# Patient Record
Sex: Female | Born: 1985 | Race: Black or African American | Hispanic: No | Marital: Single | Smoking: Current every day smoker
Health system: Southern US, Community
[De-identification: ages and names within clinical notes are randomized; demographics above are authoritative.]

## PROBLEM LIST (undated history)

## (undated) DIAGNOSIS — G894 Chronic pain syndrome: Secondary | ICD-10-CM

## (undated) DIAGNOSIS — M542 Cervicalgia: Secondary | ICD-10-CM

## (undated) DIAGNOSIS — K219 Gastro-esophageal reflux disease without esophagitis: Secondary | ICD-10-CM

## (undated) DIAGNOSIS — I10 Essential (primary) hypertension: Secondary | ICD-10-CM

## (undated) HISTORY — DX: Cervicalgia: M54.2

## (undated) HISTORY — DX: Chronic pain syndrome: G89.4

## (undated) HISTORY — PX: TONSILLECTOMY: SUR1361

## (undated) HISTORY — PX: HERNIA REPAIR: SHX51

---
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
## 2016-07-05 DIAGNOSIS — I10 Essential (primary) hypertension: Secondary | ICD-10-CM | POA: Insufficient documentation

## 2016-07-05 DIAGNOSIS — G43909 Migraine, unspecified, not intractable, without status migrainosus: Secondary | ICD-10-CM | POA: Insufficient documentation

## 2016-07-05 DIAGNOSIS — F17210 Nicotine dependence, cigarettes, uncomplicated: Secondary | ICD-10-CM | POA: Insufficient documentation

## 2016-07-06 ENCOUNTER — Emergency Department (HOSPITAL_COMMUNITY)
Admission: EM | Admit: 2016-07-06 | Discharge: 2016-07-06 | Disposition: A | Discharge status: Home / Self Care | Payer: Self-pay | Attending: Emergency Medicine | Admitting: Emergency Medicine

## 2016-07-06 ENCOUNTER — Encounter (HOSPITAL_COMMUNITY): Payer: Self-pay | Admitting: Emergency Medicine

## 2016-07-06 DIAGNOSIS — G43009 Migraine without aura, not intractable, without status migrainosus: Secondary | ICD-10-CM

## 2016-07-06 DIAGNOSIS — Z8679 Personal history of other diseases of the circulatory system: Secondary | ICD-10-CM

## 2016-07-06 HISTORY — DX: Gastro-esophageal reflux disease without esophagitis: K21.9

## 2016-07-06 HISTORY — DX: Essential (primary) hypertension: I10

## 2016-07-06 LAB — CBC WITH DIFFERENTIAL/PLATELET
BASOS PCT: 0 %
Basophils Absolute: 0 10*3/uL (ref 0.0–0.1)
EOS ABS: 0.2 10*3/uL (ref 0.0–0.7)
Eosinophils Relative: 2 %
HCT: 36.2 % (ref 36.0–46.0)
Hemoglobin: 12.4 g/dL (ref 12.0–15.0)
Lymphocytes Relative: 35 %
Lymphs Abs: 3 10*3/uL (ref 0.7–4.0)
MCH: 28.8 pg (ref 26.0–34.0)
MCHC: 34.3 g/dL (ref 30.0–36.0)
MCV: 84 fL (ref 78.0–100.0)
MONO ABS: 0.6 10*3/uL (ref 0.1–1.0)
MONOS PCT: 7 %
Neutro Abs: 4.8 10*3/uL (ref 1.7–7.7)
Neutrophils Relative %: 56 %
Platelets: 268 10*3/uL (ref 150–400)
RBC: 4.31 MIL/uL (ref 3.87–5.11)
RDW: 12.5 % (ref 11.5–15.5)
WBC: 8.7 10*3/uL (ref 4.0–10.5)

## 2016-07-06 LAB — BASIC METABOLIC PANEL
Anion gap: 5 (ref 5–15)
BUN: 13 mg/dL (ref 6–20)
CALCIUM: 8.7 mg/dL — AB (ref 8.9–10.3)
CO2: 24 mmol/L (ref 22–32)
CREATININE: 0.79 mg/dL (ref 0.44–1.00)
Chloride: 105 mmol/L (ref 101–111)
GFR calc non Af Amer: >60 mL/min (ref >60)
Glucose, Bld: 104 mg/dL — ABNORMAL HIGH (ref 65–99)
Potassium: 3.5 mmol/L (ref 3.5–5.1)
SODIUM: 134 mmol/L — AB (ref 135–145)

## 2016-07-06 MED ORDER — PROCHLORPERAZINE EDISYLATE 5 MG/ML IJ SOLN
10.0000 mg | Freq: Once | INTRAMUSCULAR | Status: AC
  Administered 2016-07-06: 10 mg via INTRAVENOUS
  Filled 2016-07-06: qty 2

## 2016-07-06 MED ORDER — DIPHENHYDRAMINE HCL 50 MG/ML IJ SOLN
25.0000 mg | Freq: Once | INTRAMUSCULAR | Status: AC
  Administered 2016-07-06: 25 mg via INTRAVENOUS
  Filled 2016-07-06: qty 1

## 2016-07-06 MED ORDER — MAGNESIUM SULFATE 2 GM/50ML IV SOLN
2.0000 g | Freq: Once | INTRAVENOUS | Status: AC
  Administered 2016-07-06: 2 g via INTRAVENOUS
  Filled 2016-07-06: qty 50

## 2016-07-06 NOTE — Discharge Instructions (Signed)
You were seen today with concerns that you may have high blood pressure. Your blood pressure here has been reassuring. Your symptoms are more consistent with a migraine. Given that your blood pressure is normal here, you will not be reinitiated on blood pressure medication. You need to follow-up to establish primary care. If you have any new or worsening symptoms she needs to be reevaluated.

## 2016-07-06 NOTE — ED Triage Notes (Signed)
Pt states that she thinks her BP is elevated d/t neck pain and vision changes she has been experiencing for last 3 days. Pt states she has been off her Bp meds for last 6 months d/t insurance issues.

## 2016-07-06 NOTE — ED Provider Notes (Signed)
[facility]-EM[facility]Y DEPT Provider Note   CSN: [id number] Arrival date & time: 07/05/16  2350  By signing my name below, I, Modena JanskyAlbert Thayil, attest that this documentation has been prepared under the direction and in the presence of Shon Batonourtney F Horton, MD . Electronically Signed: Modena JanskyAlbert Thayil, Scribe. 07/06/2016. 12:32 AM.  History   Chief Complaint Chief Complaint  Patient presents with  . Hypertension   The history is provided by the patient. No language interpreter was used.   HPI Comments: Lydia Norton is a 30 y.o. female with a hx of HTN who presents to the Emergency Department complaining of hypertension that started about 3 days ago. She states she has also been having neck pain that shoots to her head and dizziness. She currently rates the neck pain as a 10/10. She states she has intermittent numbness/ltingling to hands and photophobia. She reports being off her blood pressure medication due to insurance issues. She admits to a prior hx of similar complaints. Also has a history of migraines but reports that she doesn't have a significant headache: Neck pain at this time. She denies any fever, headache, or other complaints.   Past Medical History:  Diagnosis Date  . GERD (gastroesophageal reflux disease)   . Hypertension     There are no active problems to display for this patient.   Past Surgical History:  Procedure Laterality Date  . HERNIA REPAIR    . TONSILLECTOMY      OB History    No data available       Home Medications    Prior to Admission medications   Not on File    Family History No family history on file.  Social History Social History  Substance Use Topics  . Smoking status: Current Every Day Smoker    Packs/day: 1.00    Types: Cigarettes  . Smokeless tobacco: Never Used  . Alcohol use No     Allergies   Patient has no known allergies.   Review of Systems Review of Systems  Constitutional: Negative for fever.  Eyes: Positive for  photophobia.  Respiratory: Negative for shortness of breath.   Cardiovascular: Negative for chest pain.  Gastrointestinal: Negative for abdominal pain, nausea and vomiting.  Musculoskeletal: Positive for neck pain. Negative for neck stiffness.  Neurological: Positive for numbness. Negative for headaches.  All other systems reviewed and are negative.    Physical Exam Updated Vital Signs BP (!) 156/108 (BP Location: Right Wrist)   Pulse 83   Temp 97.9 F (36.6 C) (Oral)   Resp 20   Ht 5\' 2"  (1.575 m)   Wt 215 lb (97.5 kg)   LMP 06/12/2016 (Exact Date)   SpO2 97%   BMI 39.32 kg/m   Physical Exam  Constitutional: She is oriented to person, place, and time. She appears well-developed and well-nourished.  Morbidly obese  HENT:  Head: Normocephalic and atraumatic.  Eyes: EOM are normal. Pupils are equal, round, and reactive to light.  No nystagmus noted  Neck: Normal range of motion. Neck supple.  Cardiovascular: Normal rate, regular rhythm and normal heart sounds.   Pulmonary/Chest: Effort normal and breath sounds normal. No respiratory distress. She has no wheezes.  Abdominal: Soft. Bowel sounds are normal. There is no tenderness.  Neurological: She is alert and oriented to person, place, and time.  Cranial nerves II through XII intact, no dysmetria to finger-nose-finger, 5 out of 5 strength in all 4 extremities  Skin: Skin is warm and dry.  Psychiatric:  She has a normal mood and affect.  Nursing note and vitals reviewed.    ED Treatments / Results  DIAGNOSTIC STUDIES: Oxygen Saturation is 97% on RA, normal by my interpretation.    COORDINATION OF CARE: 12:37 AM- Pt advised of plan for treatment and pt agrees.  Labs (all labs ordered are listed, but only abnormal results are displayed) Labs Reviewed  BASIC METABOLIC PANEL - Abnormal; Notable for the following:       Result Value   Sodium 134 (*)    Glucose, Bld 104 (*)    Calcium 8.7 (*)    All other components  within normal limits  CBC WITH DIFFERENTIAL/PLATELET    EKG  EKG Interpretation  Date/Time:  Tuesday July 06 2016 00:45:59 EST Ventricular Rate:  82 PR Interval:    QRS Duration: 95 QT Interval:  387 QTC Calculation: 452 R Axis:   6 Text Interpretation:  Sinus rhythm Low voltage, precordial leads Confirmed by HORTON  MD, Toni AmendOURTNEY ([id number]) on 07/06/2016 12:58:28 AM       Radiology No results found.  Procedures Procedures (including critical care time)  Medications Ordered in ED Medications  prochlorperazine (COMPAZINE) injection 10 mg (10 mg Intravenous Given 07/06/16 0107)  diphenhydrAMINE (BENADRYL) injection 25 mg (25 mg Intravenous Given 07/06/16 0106)  magnesium sulfate IVPB 2 g 50 mL (2 g Intravenous New Bag/Given 07/06/16 0107)     Initial Impression / Assessment and Plan / ED Course  I have reviewed the triage vital signs and the nursing notes.  Pertinent labs & imaging results that were available during my care of the patient were reviewed by me and considered in my medical decision making (see chart for details).  Clinical Course     Patient presents with concerns for elevated blood pressure. Initial blood pressure elevated, specifically diastolic. However patient's cough does not appear to be appropriately sized. With recheck, patient's blood pressures 124/75. She is neurologically intact. She does have a history of migraines and has photophobia and some characteristics of migraine based on history of present illness. She was given migraine cocktail. Lab work obtained and largely reassuring. EKG is nonischemic. On recheck, patient is resting comfortably. She reports complete resolution of symptoms. Suspect atypical migraine. Multiple blood pressure rechecks are within normal range. Will defer reinitiating blood pressure medications at this time. Patient was given information to establish primary care for close follow-up.  After history, exam, and medical workup  I feel the patient has been appropriately medically screened and is safe for discharge home. Pertinent diagnoses were discussed with the patient. Patient was given return precautions.   Final Clinical Impressions(s) / ED Diagnoses   Final diagnoses:  Migraine without aura and without status migrainosus, not intractable  History of hypertension    New Prescriptions New Prescriptions   No medications on file   I personally performed the services described in this documentation, which was scribed in my presence. The recorded information has been reviewed and is accurate.     Shon Batonourtney F Horton, MD 07/06/16 [phone]

## 2016-07-06 NOTE — ED Notes (Signed)
BP cuff changed and BP in pt's R upper arm was 124/75.

## 2016-07-21 ENCOUNTER — Emergency Department (HOSPITAL_COMMUNITY)
Admission: EM | Admit: 2016-07-21 | Discharge: 2016-07-21 | Disposition: A | Discharge status: Home / Self Care | Payer: Medicaid Other | Attending: Emergency Medicine | Admitting: Emergency Medicine

## 2016-07-21 ENCOUNTER — Encounter (HOSPITAL_COMMUNITY): Payer: Self-pay | Admitting: Emergency Medicine

## 2016-07-21 DIAGNOSIS — J020 Streptococcal pharyngitis: Secondary | ICD-10-CM

## 2016-07-21 DIAGNOSIS — I10 Essential (primary) hypertension: Secondary | ICD-10-CM | POA: Diagnosis not present

## 2016-07-21 DIAGNOSIS — K0889 Other specified disorders of teeth and supporting structures: Secondary | ICD-10-CM | POA: Diagnosis not present

## 2016-07-21 DIAGNOSIS — F17210 Nicotine dependence, cigarettes, uncomplicated: Secondary | ICD-10-CM | POA: Insufficient documentation

## 2016-07-21 DIAGNOSIS — J029 Acute pharyngitis, unspecified: Secondary | ICD-10-CM | POA: Diagnosis present

## 2016-07-21 LAB — RAPID STREP SCREEN (MED CTR MEBANE ONLY): Streptococcus, Group A Screen (Direct): POSITIVE — AB

## 2016-07-21 MED ORDER — ONDANSETRON 8 MG PO TBDP
8.0000 mg | ORAL_TABLET | Freq: Once | ORAL | Status: AC
  Administered 2016-07-21: 8 mg via ORAL
  Filled 2016-07-21: qty 1

## 2016-07-21 MED ORDER — AMOXICILLIN 500 MG PO CAPS: 500.0000 mg | ORAL_CAPSULE | Freq: Three times a day (TID) | ORAL | 0 refills | Status: AC

## 2016-07-21 MED ORDER — IBUPROFEN 800 MG PO TABS
800.0000 mg | ORAL_TABLET | Freq: Once | ORAL | Status: AC
  Administered 2016-07-21: 800 mg via ORAL
  Filled 2016-07-21: qty 1

## 2016-07-21 MED ORDER — MAGIC MOUTHWASH W/LIDOCAINE: 10.0000 mL | Freq: Four times a day (QID) | ORAL | 0 refills | Status: DC | PRN

## 2016-07-21 NOTE — ED Provider Notes (Signed)
[facility] Provider Note   CSN: [id number] Arrival date & time: 07/21/16  [id number]     History   Chief Complaint Chief Complaint  Patient presents with  . Sore Throat    HPI Lydia Norton is a 30 y.o. female presenting with multiple complaints, mainly suspects she may have the flu given her recent exposure to her uncle with this diagnosis.  She endorses Symptoms which include severe sore throat, worse with swallowing and radiation to her right ear, subjective fever, nasal congestion with clear rhinorrhea including postnasal drip, generalized body aches, nonproductive cough.  She also endorses right lower dentition pain, stating she has chronic decay and fractures in her molars with increased pain but no gingival swelling today.  She has difficulty eating and states she got choked yesterday due to severe throat pain which caused vomiting, although she denies abdominal pain or persistent nausea.  She has had a dose of TheraFlu yesterday and a borrowed Percocet from a family member which have not improved her symptoms.    Of note, patient presents with a can of Pringles, box of Crunch and Munch and a fruit drink.   The history is provided by the patient.    Past Medical History:  Diagnosis Date  . GERD (gastroesophageal reflux disease)   . Hypertension     There are no active problems to display for this patient.   Past Surgical History:  Procedure Laterality Date  . HERNIA REPAIR    . TONSILLECTOMY      OB History    No data available       Home Medications    Prior to Admission medications   Medication Sig Start Date End Date Taking? Authorizing Provider  amoxicillin (AMOXIL) 500 MG capsule Take 1 capsule (500 mg total) by mouth 3 (three) times daily. 07/21/16 07/31/16  Burgess AmorJulie Alicha Raspberry, PA-C  magic mouthwash w/lidocaine SOLN Take 10 mLs by mouth 4 (four) times daily as needed (throat pain). Note to pharmacy - equal parts diphendydramine, aluminum hydroxide and  lidocaine HCL 07/21/16   Burgess AmorJulie Brystol Wasilewski, PA-C    Family History History reviewed. No pertinent family history.  Social History Social History  Substance Use Topics  . Smoking status: Current Every Day Smoker    Packs/day: 1.00    Types: Cigarettes  . Smokeless tobacco: Never Used  . Alcohol use No     Allergies   Lisinopril   Review of Systems Review of Systems  Constitutional: Positive for fever.  HENT: Positive for congestion, dental problem, postnasal drip and rhinorrhea. Negative for sinus pain.   Eyes: Negative.   Respiratory: Positive for cough. Negative for chest tightness and shortness of breath.   Cardiovascular: Negative for chest pain.  Gastrointestinal: Positive for vomiting. Negative for abdominal pain and nausea.  Genitourinary: Negative.   Musculoskeletal: Negative for arthralgias, joint swelling and neck pain.  Skin: Negative.  Negative for rash and wound.  Neurological: Negative for dizziness, weakness, light-headedness, numbness and headaches.  Psychiatric/Behavioral: Negative.      Physical Exam Updated Vital Signs BP (!) 140/105 (BP Location: Right Arm)   Pulse 102   Temp 98.3 F (36.8 C)   Resp 18   Ht 5\' 1"  (1.549 m)   Wt 95.3 kg   LMP 07/15/2016   SpO2 97%   BMI 39.68 kg/m   Physical Exam  Constitutional: She is oriented to person, place, and time. She appears well-developed and well-nourished.  HENT:  Head: Normocephalic and atraumatic.  Right Ear:  Tympanic membrane and ear canal normal.  Left Ear: Tympanic membrane and ear canal normal.  Nose: Mucosal edema and rhinorrhea present.  Mouth/Throat: Uvula is midline and mucous membranes are normal. Posterior oropharyngeal erythema present. No oropharyngeal exudate, posterior oropharyngeal edema or tonsillar abscesses.  Mild erythema of the soft palate.  Eyes: Conjunctivae are normal.  Cardiovascular: Normal rate and normal heart sounds.   Pulmonary/Chest: Effort normal. No respiratory  distress. She has no wheezes. She has no rales.  Abdominal: Soft. There is no tenderness.  Musculoskeletal: Normal range of motion.  Lymphadenopathy:    She has no cervical adenopathy.  Neurological: She is alert and oriented to person, place, and time.  Skin: Skin is warm and dry. No rash noted.  Psychiatric: She has a normal mood and affect.     ED Treatments / Results  Labs (all labs ordered are listed, but only abnormal results are displayed) Labs Reviewed  RAPID STREP SCREEN (NOT AT [facility]) - Abnormal; Notable for the following:       Result Value   Streptococcus, Group A Screen (Direct) POSITIVE (*)    All other components within normal limits    EKG  EKG Interpretation None       Radiology No results found.  Procedures Procedures (including critical care time)  Medications Ordered in ED Medications  ibuprofen (ADVIL,MOTRIN) tablet 800 mg (800 mg Oral Given 07/21/16 0914)  ondansetron (ZOFRAN-ODT) disintegrating tablet 8 mg (8 mg Oral Given 07/21/16 0914)     Initial Impression / Assessment and Plan / ED Course  I have reviewed the triage vital signs and the nursing notes.  Pertinent labs & imaging results that were available during my care of the patient were reviewed by me and considered in my medical decision making (see chart for details).  Clinical Course     Rest, increased fluids, Tylenol or Motrin for fever reduction in pain relief.  Amoxil and magic mouthwash prescribed.  The patient appears reasonably screened and/or stabilized for discharge and I doubt any other medical condition or other [facility] requiring further screening, evaluation, or treatment in the ED at this time prior to discharge.   Final Clinical Impressions(s) / ED Diagnoses   Final diagnoses:  Strep throat    New Prescriptions New Prescriptions   AMOXICILLIN (AMOXIL) 500 MG CAPSULE    Take 1 capsule (500 mg total) by mouth 3 (three) times daily.   MAGIC MOUTHWASH W/LIDOCAINE SOLN     Take 10 mLs by mouth 4 (four) times daily as needed (throat pain). Note to pharmacy - equal parts diphendydramine, aluminum hydroxide and lidocaine HCL     Burgess AmorJulie Rayley Gao, PA-C 07/21/16 1041    Marily MemosJason Mesner, MD 07/21/16 1427

## 2016-07-21 NOTE — ED Triage Notes (Signed)
C/o sore throat, chills, HA, lower back pain and right lower teeth.  Rates pain 10/10.

## 2016-10-05 ENCOUNTER — Encounter (HOSPITAL_COMMUNITY): Payer: Self-pay | Admitting: Emergency Medicine

## 2016-10-05 ENCOUNTER — Emergency Department (HOSPITAL_COMMUNITY)
Admission: EM | Admit: 2016-10-05 | Discharge: 2016-10-05 | Disposition: A | Discharge status: Home / Self Care | Payer: Medicaid Other | Attending: Emergency Medicine | Admitting: Emergency Medicine

## 2016-10-05 DIAGNOSIS — I10 Essential (primary) hypertension: Secondary | ICD-10-CM | POA: Insufficient documentation

## 2016-10-05 DIAGNOSIS — F17210 Nicotine dependence, cigarettes, uncomplicated: Secondary | ICD-10-CM | POA: Insufficient documentation

## 2016-10-05 DIAGNOSIS — R1030 Lower abdominal pain, unspecified: Secondary | ICD-10-CM | POA: Diagnosis present

## 2016-10-05 DIAGNOSIS — K5900 Constipation, unspecified: Secondary | ICD-10-CM | POA: Diagnosis not present

## 2016-10-05 LAB — COMPREHENSIVE METABOLIC PANEL
ALK PHOS: 49 U/L (ref 38–126)
ALT: 32 U/L (ref 14–54)
AST: 22 U/L (ref 15–41)
Albumin: 4 g/dL (ref 3.5–5.0)
Anion gap: 7 (ref 5–15)
BILIRUBIN TOTAL: 0.4 mg/dL (ref 0.3–1.2)
BUN: 10 mg/dL (ref 6–20)
CALCIUM: 9.1 mg/dL (ref 8.9–10.3)
CO2: 26 mmol/L (ref 22–32)
CREATININE: 0.61 mg/dL (ref 0.44–1.00)
Chloride: 104 mmol/L (ref 101–111)
Glucose, Bld: 93 mg/dL (ref 65–99)
Potassium: 3.4 mmol/L — ABNORMAL LOW (ref 3.5–5.1)
Sodium: 137 mmol/L (ref 135–145)
TOTAL PROTEIN: 7.4 g/dL (ref 6.5–8.1)

## 2016-10-05 LAB — CBC
HCT: 40.6 % (ref 36.0–46.0)
Hemoglobin: 14.1 g/dL (ref 12.0–15.0)
MCH: 29.1 pg (ref 26.0–34.0)
MCHC: 34.7 g/dL (ref 30.0–36.0)
MCV: 83.9 fL (ref 78.0–100.0)
PLATELETS: 278 10*3/uL (ref 150–400)
RBC: 4.84 MIL/uL (ref 3.87–5.11)
RDW: 13 % (ref 11.5–15.5)
WBC: 8.4 10*3/uL (ref 4.0–10.5)

## 2016-10-05 LAB — LIPASE, BLOOD: Lipase: 20 U/L (ref 11–51)

## 2016-10-05 NOTE — ED Notes (Signed)
ED Provider at bedside. 

## 2016-10-05 NOTE — Discharge Instructions (Signed)
Miralax - twice daily Magnesium citrate (drink a whole 6 or 8 ounce bottle today) Dulcolax twice daily  Until you are having regular daily stools.  [facility]    Kari BaarsEdward Hawkins MD. Specialty: Pulmonary Disease Contact information: [address]  [address]  [place] [address]  [phone]   Syliva OvermanMargaret Simpson, MD. Specialty: [facility] Contact information: [address]  [place] [address]  [phone]   Lilyan PuntScott Luking, MD. Specialty: [facility] Contact information: [address]  [address]  [place] [address]  [phone]   Avon Gullyesfaye Fanta, MD Specialty: Internal Medicine Contact information: [address]  [place] [address]  [phone]   Catalina PizzaZach Hall, MD. Specialty: Internal Medicine Contact information: [address]  [place] [address]  [phone]   Butch PennyAngus Mcinnis, MD. Specialty: Family Medicine Contact information: [address]  [place] [address]  [phone]   John GiovanniStephen Knowlton, MD. Specialty: [facility] Contact information: [address]  [address]  [place] [address]  [phone]   Carylon Perchesoy Fagan, MD. Specialty: Internal Medicine Contact information: [address]  [address]  [place] [address]  [phone]

## 2016-10-05 NOTE — ED Notes (Signed)
No prescriptions given. Pt educated about OTC medications pt can buy.

## 2016-10-05 NOTE — ED Triage Notes (Signed)
Patient complains of lower abdominal pain and constipation. States no bowel movement x 4 days. Denies n/v.

## 2016-10-05 NOTE — ED Provider Notes (Signed)
[facility] DEPT Provider Note   CSN: [id number] Arrival date & time: 10/05/16  [id number]  By signing my name below, I, Cynda AcresHailei Fulton, attest that this documentation has been prepared under the direction and in the presence of Eber HongBrian Monay Houlton, MD. Electronically Signed: Cynda AcresHailei Fulton, Scribe. 10/05/16. 11:25 AM.  History   Chief Complaint Chief Complaint  Patient presents with  . Abdominal Pain    HPI Comments: Lydia Norton is a 31 y.o. female with a hx of GERD and hypertension, who presents to the Emergency Department complaining of sudden-onset, constant lower abdominal pain that began 4 days ago. Patient states when she is using the bathroom her vagina feels as if something is coming out of her vagina. Patient reports associated constipation. Patient reports a 5 day use of loratab, due to chronic dental pain. Patient states she has been using her aunts medication. Patient is tolerating fluids well. Patient has not ate anything this morning, but ate well last night. Patient denies any alcohol use, nausea, vomiting, diarrhea, or fever.   The history is provided by the patient. No language interpreter was used.    Past Medical History:  Diagnosis Date  . GERD (gastroesophageal reflux disease)   . Hypertension     There are no active problems to display for this patient.   Past Surgical History:  Procedure Laterality Date  . HERNIA REPAIR    . TONSILLECTOMY      OB History    Gravida Para Term Preterm AB Living   1             SAB TAB Ectopic Multiple Live Births                   Home Medications    Prior to Admission medications   Not on File    Family History No family history on file.  Social History Social History  Substance Use Topics  . Smoking status: Current Every Day Smoker    Packs/day: 1.00    Types: Cigarettes  . Smokeless tobacco: Never Used  . Alcohol use No     Allergies   Lisinopril   Review of Systems Review of Systems  All other  systems reviewed and are negative.    Physical Exam Updated Vital Signs BP 144/86 (BP Location: Right Arm)   Pulse 72   Temp 98.2 F (36.8 C) (Oral)   Ht 5\' 3"  (1.6 m)   Wt 210 lb (95.3 kg)   LMP 08/16/2016   SpO2 100%   BMI 37.20 kg/m   Physical Exam  Constitutional: She is oriented to person, place, and time. She appears well-developed.  HENT:  Head: Normocephalic and atraumatic.  Mouth/Throat: Oropharynx is clear and moist.  Eyes: Conjunctivae and EOM are normal. Pupils are equal, round, and reactive to light.  Neck: Normal range of motion. Neck supple.  Cardiovascular: Normal rate, regular rhythm and normal heart sounds.   Pulmonary/Chest: Effort normal and breath sounds normal.  Abdominal: Soft. Bowel sounds are normal. She exhibits no distension. There is no tenderness.  Mild tenderness of the abdomen. Patient is very obese, but soft.   Musculoskeletal: Normal range of motion.  Neurological: She is alert and oriented to person, place, and time.  Skin: Skin is warm and dry.  Psychiatric: She has a normal mood and affect.     ED Treatments / Results  DIAGNOSTIC STUDIES: Oxygen Saturation is 100% on RA, normal by my interpretation.    COORDINATION OF CARE: 11:22  AM Discussed treatment plan with pt at bedside and pt agreed to plan, which includes a pelvic examination.   Labs (all labs ordered are listed, but only abnormal results are displayed) Labs Reviewed  COMPREHENSIVE METABOLIC PANEL - Abnormal; Notable for the following:       Result Value   Potassium 3.4 (*)    All other components within normal limits  LIPASE, BLOOD  CBC  URINALYSIS, ROUTINE W REFLEX MICROSCOPIC  PREGNANCY, URINE  POC URINE PREG, ED    Radiology No results found.  Procedures Procedures (including critical care time)  Medications Ordered in ED Medications - No data to display   Initial Impression / Assessment and Plan / ED Course  I have reviewed the triage vital signs and  the nursing notes.  Pertinent labs & imaging results that were available during my care of the patient were reviewed by me and considered in my medical decision making (see chart for details).   chaperone present for vaginal and rectal exam, normal-appearing vagina, no intravaginal foreign bodies, there is stool palpated in the posterior vaginal wall into the rectum. There is no connection, no stool in the vaginal vault, rectal exam shows hard stool which is mobile, no blood, no masses discussed at length with the patient the indication for further evaluation and treatment. She will be placed on MiraLAX, magnesium citrate and Dulcolax, she expressed understanding.     Final Clinical Impressions(s) / ED Diagnoses   Final diagnoses:  Constipation, unspecified constipation type    New Prescriptions New Prescriptions   No medications on file    I personally performed the services described in this documentation, which was scribed in my presence. The recorded information has been reviewed and is accurate.       Eber Hong, MD 10/05/16 1239

## 2017-09-29 ENCOUNTER — Other Ambulatory Visit: Payer: Self-pay | Admitting: Obstetrics & Gynecology

## 2017-09-29 DIAGNOSIS — O3680X0 Pregnancy with inconclusive fetal viability, not applicable or unspecified: Secondary | ICD-10-CM

## 2017-09-30 ENCOUNTER — Encounter (INDEPENDENT_AMBULATORY_CARE_PROVIDER_SITE_OTHER): Payer: Self-pay

## 2017-09-30 ENCOUNTER — Ambulatory Visit (INDEPENDENT_AMBULATORY_CARE_PROVIDER_SITE_OTHER): Payer: Medicaid Other

## 2017-09-30 DIAGNOSIS — O3680X0 Pregnancy with inconclusive fetal viability, not applicable or unspecified: Secondary | ICD-10-CM

## 2017-09-30 DIAGNOSIS — Z3A08 8 weeks gestation of pregnancy: Secondary | ICD-10-CM | POA: Diagnosis not present

## 2017-09-30 DIAGNOSIS — O208 Other hemorrhage in early pregnancy: Secondary | ICD-10-CM

## 2017-09-30 NOTE — Progress Notes (Addendum)
US 8+2 wks,single IUP w/ys,positive fht 109 bpm,normal ovaries bilat,subchorionic hemorrhage 5.4 x 3.8 x 1.6 cm,small GS 26.2 mm=7+4 wks

## 2017-10-06 ENCOUNTER — Other Ambulatory Visit: Payer: Self-pay | Admitting: Women's Health

## 2017-10-06 DIAGNOSIS — O3680X0 Pregnancy with inconclusive fetal viability, not applicable or unspecified: Secondary | ICD-10-CM

## 2017-10-10 ENCOUNTER — Ambulatory Visit: Payer: Medicaid Other | Admitting: Obstetrics & Gynecology

## 2017-10-10 ENCOUNTER — Other Ambulatory Visit: Payer: Self-pay | Admitting: Women's Health

## 2017-10-10 ENCOUNTER — Ambulatory Visit (INDEPENDENT_AMBULATORY_CARE_PROVIDER_SITE_OTHER): Payer: Medicaid Other

## 2017-10-10 DIAGNOSIS — Z3A08 8 weeks gestation of pregnancy: Secondary | ICD-10-CM | POA: Diagnosis not present

## 2017-10-10 DIAGNOSIS — O3680X0 Pregnancy with inconclusive fetal viability, not applicable or unspecified: Secondary | ICD-10-CM

## 2017-10-10 NOTE — Progress Notes (Signed)
US 8+4 wks,single IUP,NO FHT,CRL 20.50 mm,normal ovaries bilat,Dr Eure discussed results w/pt

## 2017-10-11 ENCOUNTER — Other Ambulatory Visit: Payer: Self-pay | Admitting: Obstetrics & Gynecology

## 2017-10-11 ENCOUNTER — Telehealth: Payer: Self-pay | Admitting: Obstetrics & Gynecology

## 2017-10-11 MED ORDER — HYDROCODONE-ACETAMINOPHEN 5-325 MG PO TABS: 1.0000 | ORAL_TABLET | Freq: Four times a day (QID) | ORAL | 0 refills | Status: AC | PRN

## 2017-10-11 MED ORDER — MISOPROSTOL 200 MCG PO TABS: ORAL_TABLET | ORAL | 0 refills | Status: DC

## 2017-10-11 NOTE — Telephone Encounter (Signed)
Called patient back and informed her of Dr. Forestine ChuteEure's recommendations. Patient has no further questions or concerns.

## 2017-10-11 NOTE — Telephone Encounter (Signed)
Pt would like to get the medication sent to her pharmacy for her sab. Patient would like to also know why she cannot have a d&c done.

## 2017-10-11 NOTE — Telephone Encounter (Signed)
For pregnancies less than 10 weeks D&C is not recommended, as the risks of complications of the D&C are greater than the risks of spontaneous pregnancy loss using the medicine or not  The prescription for the cytotec is e prescribed

## 2017-10-13 ENCOUNTER — Encounter: Payer: Medicaid Other | Admitting: Women's Health

## 2017-10-13 ENCOUNTER — Ambulatory Visit: Payer: Medicaid Other | Admitting: *Deleted

## 2017-10-13 ENCOUNTER — Other Ambulatory Visit: Payer: Medicaid Other

## 2018-09-29 ENCOUNTER — Encounter (HOSPITAL_COMMUNITY): Payer: Self-pay

## 2020-02-22 LAB — BASIC METABOLIC PANEL
BUN: 10 (ref 4–21)
Creatinine: 0.3 — AB (ref 0.5–1.1)

## 2020-02-22 LAB — COMPREHENSIVE METABOLIC PANEL
GFR calc Af Amer: 169
GFR calc non Af Amer: 146

## 2020-02-22 LAB — TSH: TSH: 0.01 — AB (ref 0.41–5.90)

## 2020-02-22 LAB — HEMOGLOBIN A1C: Hemoglobin A1C: 4.9

## 2020-02-23 LAB — LIPID PANEL
Cholesterol: 194 (ref 0–200)
HDL: 53 (ref 35–70)
LDL Cholesterol: 109
Triglycerides: 201 — AB (ref 40–160)

## 2020-06-05 ENCOUNTER — Other Ambulatory Visit: Payer: Self-pay

## 2020-06-05 ENCOUNTER — Encounter: Payer: Self-pay | Admitting: "Endocrinology

## 2020-06-05 ENCOUNTER — Ambulatory Visit (INDEPENDENT_AMBULATORY_CARE_PROVIDER_SITE_OTHER): Payer: Medicaid Other | Admitting: "Endocrinology

## 2020-06-05 VITALS — BP 130/72 | HR 88 | Ht 63.0 in | Wt 190.0 lb

## 2020-06-05 DIAGNOSIS — E0500 Thyrotoxicosis with diffuse goiter without thyrotoxic crisis or storm: Secondary | ICD-10-CM

## 2020-06-05 DIAGNOSIS — E0590 Thyrotoxicosis, unspecified without thyrotoxic crisis or storm: Secondary | ICD-10-CM

## 2020-06-05 MED ORDER — PROPRANOLOL HCL 20 MG PO TABS: 20.0000 mg | ORAL_TABLET | Freq: Two times a day (BID) | ORAL | 1 refills | Status: DC

## 2020-06-05 MED ORDER — PREDNISONE 10 MG PO TABS: 10.0000 mg | ORAL_TABLET | Freq: Every day | ORAL | 0 refills | Status: DC

## 2020-06-05 MED ORDER — METHIMAZOLE 10 MG PO TABS
10.0000 mg | ORAL_TABLET | Freq: Two times a day (BID) | ORAL | 1 refills | Status: DC
Start: 2020-06-05 — End: 2020-08-28

## 2020-06-05 NOTE — Progress Notes (Signed)
06/05/2020     Endocrinology Consult Note    Subjective:    Patient ID: Lydia Norton, female    DOB: [birth year], PCP Patient, No Pcp Per.   Past Medical History:  Diagnosis Date  . Cervicalgia   . Chronic pain syndrome   . GERD (gastroesophageal reflux disease)   . Hypertension     Past Surgical History:  Procedure Laterality Date  . HERNIA REPAIR    . TONSILLECTOMY      Social History   Socioeconomic History  . Marital status: Single    Spouse name: Not on file  . Number of children: Not on file  . Years of education: Not on file  . Highest education level: Not on file  Occupational History  . Not on file  Tobacco Use  . Smoking status: Current Every Day Smoker    Packs/day: 1.00    Types: Cigarettes  . Smokeless tobacco: Never Used  Substance and Sexual Activity  . Alcohol use: No  . Drug use: No  . Sexual activity: Not on file  Other Topics Concern  . Not on file  Social History Narrative  . Not on file   Social Determinants of Health   Financial Resource Strain:   . Difficulty of Paying Living Expenses: Not on file  Food Insecurity:   . Worried About Programme researcher, broadcasting/film/video in the Last Year: Not on file  . Ran Out of Food in the Last Year: Not on file  Transportation Needs:   . Lack of Transportation (Medical): Not on file  . Lack of Transportation (Non-Medical): Not on file  Physical Activity:   . Days of Exercise per Week: Not on file  . Minutes of Exercise per Session: Not on file  Stress:   . Feeling of Stress : Not on file  Social Connections:   . Frequency of Communication with Friends and Family: Not on file  . Frequency of Social Gatherings with Friends and Family: Not on file  . Attends Religious Services: Not on file  . Active Member of Clubs or Organizations: Not on file  . Attends Banker Meetings: Not on file  . Marital Status: Not on file    Family History  Problem Relation Age of Onset  . Hypertension  Mother   . Diabetes Father   . Hypertension Father   . Hyperlipidemia Father   . Heart attack Father   . Stroke Father   . Heart failure Father     Outpatient Encounter Medications as of 06/05/2020  Medication Sig  . amLODipine (NORVASC) 5 MG tablet Take 5 mg by mouth daily.  Marland Kitchen HYDROcodone-acetaminophen (NORCO/VICODIN) 5-325 MG tablet Take 1 tablet by mouth every 6 (six) hours as needed.  . methimazole (TAPAZOLE) 10 MG tablet Take 1 tablet (10 mg total) by mouth 2 (two) times daily.  Marland Kitchen omeprazole (PRILOSEC) 40 MG capsule Take 40 mg by mouth daily.  . predniSONE (DELTASONE) 10 MG tablet Take 1 tablet (10 mg total) by mouth daily with breakfast.  . propranolol (INDERAL) 20 MG tablet Take 1 tablet (20 mg total) by mouth 2 (two) times daily.  . [DISCONTINUED] methimazole (TAPAZOLE) 5 MG tablet Take 1 tablet by mouth 3 (three) times daily.  . [DISCONTINUED] misoprostol (CYTOTEC) 200 MCG tablet Take all 4 tablets at once, repeat in 24 hours if miscarriage has not occurred   No facility-administered encounter medications on file as of 06/05/2020.    ALLERGIES: Allergies  Allergen Reactions  . Lisinopril Swelling    VACCINATION STATUS:  There is no immunization history on file for this patient.   HPI  Lydia Norton is 34 y.o. female who presents today with a medical history as above. she is being seen in consultation for hyperthyroidism requested by  Trenda Moots, NP. Patient was started on methimazole 5 mg 3 times daily 2 months ago with clinical response. Prior to initiation of treatment,she has been dealing with symptoms of weight loss of 100+ pounds, palpitations, tremors, and heat intolerance, sleep disturbance on and off for more than a year.  -Although they are still there, the symptoms have slightly improved over the last several weeks.   -She did not undergo thyroid uptake and scan.  She underwent thyroid ultrasound which showed diffuse enlargement of her thyroid lobes  with no discrete nodules. she denies dysphagia, choking, shortness of breath, no recent voice change.    she has family history of thyroid dysfunction but denies family hx of thyroid cancer. she reports that she discovered her own goiter approximately 2 years ago.   she reports compliance with her methimazole 5 mg p.o. 3 times daily, not on beta-blockers, not on steroids.                            Review of systems  Constitutional: + weight loss, + fatigue, + subjective hyperthermia Eyes: no blurry vision, ++ xerophthalmia ENT: no sore throat, no nodules palpated in throat, no dysphagia/odynophagia, nor hoarseness Cardiovascular: no Chest Pain, no Shortness of Breath, ++  palpitations, no leg swelling Respiratory: no cough, no SOB Gastrointestinal: no Nausea, no Vomiting, no Diarhhea Musculoskeletal: no muscle/joint aches Skin: no rashes Neurological: ++  tremors, no numbness, no tingling, no dizziness Psychiatric: no depression, ++  anxiety   Objective:    BP 130/72   Pulse 88   Ht 5\' 3"  (1.6 m)   Wt 190 lb (86.2 kg)   BMI 33.66 kg/m   Wt Readings from Last 3 Encounters:  06/05/20 190 lb (86.2 kg)  10/05/16 210 lb (95.3 kg)  07/21/16 210 lb (95.3 kg)                                                Physical exam  Constitutional: Body mass index is 33.66 kg/m., not in acute distress, ++ anxious state of mind Eyes: PERRLA, EOMI, ++ exophthalmos ENT: moist mucous membranes, ++  Thyromegaly, smooth, + bruit, no cervical lymphadenopathy Cardiovascular: ++ precordial activity, -tachycardic,  no Murmur/Rubs/Gallops Respiratory:  adequate breathing efforts, no gross chest deformity, Clear to auscultation bilaterally Gastrointestinal: abdomen soft, Non -tender, No distension, Bowel Sounds present Musculoskeletal: no gross deformities, strength intact in all four extremities Skin: moist, warm, no rashes Neurological: ++  tremor with outstretched hands,  +++ Deep Tendon Reflexes  on  both lower extremities.   CMP     Component Value Date/Time   NA 137 10/05/2016 1101   K 3.4 (L) 10/05/2016 1101   CL 104 10/05/2016 1101   CO2 26 10/05/2016 1101   GLUCOSE 93 10/05/2016 1101   BUN 10 02/22/2020 0000   CREATININE 0.3 (A) 02/22/2020 0000   CREATININE 0.61 10/05/2016 1101   CALCIUM 9.1 10/05/2016 1101   PROT 7.4 10/05/2016 1101   ALBUMIN 4.0 10/05/2016 1101  AST 22 10/05/2016 1101   ALT 32 10/05/2016 1101   ALKPHOS 49 10/05/2016 1101   BILITOT 0.4 10/05/2016 1101   GFRNONAA 146 02/22/2020 0000   GFRAA 169 02/22/2020 0000     CBC    Component Value Date/Time   WBC 8.4 10/05/2016 1101   RBC 4.84 10/05/2016 1101   HGB 14.1 10/05/2016 1101   HCT 40.6 10/05/2016 1101   PLT 278 10/05/2016 1101   MCV 83.9 10/05/2016 1101   MCH 29.1 10/05/2016 1101   MCHC 34.7 10/05/2016 1101   RDW 13.0 10/05/2016 1101   LYMPHSABS 3.0 07/06/2016 0039   MONOABS 0.6 07/06/2016 0039   EOSABS 0.2 07/06/2016 0039   BASOSABS 0.0 07/06/2016 0039     Diabetic Labs (most recent): Lab Results  Component Value Date   HGBA1C 4.9 02/22/2020    Lipid Panel     Component Value Date/Time   CHOL 194 02/22/2020 0000   TRIG 201 (A) 02/22/2020 0000   HDL 53 02/22/2020 0000   LDLCALC 109 02/22/2020 0000     Lab Results  Component Value Date   TSH 0.01 (A) 02/22/2020     Thyroid ultrasound on May 13, 2020: Right lobe 6.5 x 3.1 x 3.2 cm, heterogeneous, vascular.  No nodules. Left lobe: 6.5 x 3.4 x 2.9 cm heterogeneous, vascular.  No nodules.   Assessment & Plan:   1. Hyperthyroidism 2. Graves disease  she is being seen at a kind request of Trenda Moots, NP. her history and most recent labs are reviewed, and she was examined clinically. Subjective and objective findings are consistent with thyrotoxicosis likely from primary hyperthyroidism/Graves' disease. The potential risks of untreated thyrotoxicosis and the need for definitive therapy have been discussed in  detail with her, and she agrees to proceed with diagnostic workup and treatment plan. We had a long discussion about options of treatment.  This patient will eventually benefit from radioactive iodine ablation.   -In light of her still significantly symptomatic presentation, she will be treated with higher dose of methimazole, beta-blocker, and brief course of steroids to achieve safe range of thyroid hormone before we withdrawing for thyroid uptake and scan.   We discussed and increase methimazole to 10 mg p.o. twice daily, add propanolol 20 mg p.o. twice daily, and added prednisone 10 mg p.o. daily for 15 days.    -Patient is urged to maintain close follow-up to complete the treatment of her severe hypothyroidism to prevent her from thyroid storm and acute and chronic complications of thyrotoxicosis.    Patient is made aware of the high likelihood of post ablative hypothyroidism with subsequent need for lifelong thyroid hormone replacement. she  understands this outcome  and she is  willing to proceed.    In light of her diffusely enlarged vascular thyroid with no discrete nodules, treatment with I-131 will be effective to lower the size of her thyroid to manageable degree, making surgery unnecessary in her case.  she will return in 9 weeks, with repeat labs to assess treatment response.  Her current degree of thyroid hormone burden will complicate pregnancy.  She is advised to postpone all pregnancy until her thyroid is controlled to safe range.  She is advised to use effective contraceptive methods to achieve that.  -I did refill her methimazole, prescribed propanolol and prednisone.  -Patient is advised to maintain close follow up with her PCP  for primary care needs.   - Time spent with the patient: 60 minutes, of which >50% was  spent in obtaining information about her symptoms, reviewing her previous labs, evaluations, and treatments, counseling her about her hyperthyroidism from Graves'  disease, and developing a plan to confirm the diagnosis and long term treatment as necessary. Please refer to " Patient Self Inventory" in the Media  tab for reviewed elements of pertinent patient history.  Lydia LoosenIllene K Rawling participated in the discussions, expressed understanding, and voiced agreement with the above plans.  All questions were answered to her satisfaction. she is encouraged to contact clinic should she have any questions or concerns prior to her return visit.   Follow up plan: Return in about 9 weeks (around 08/07/2020) for F/U with Pre-visit Labs.   Thank you for involving me in the care of this pleasant patient, and I will continue to update you with her progress.  Marquis LunchGebre Thia Olesen, MD [facility] Phone: [phone]  Fax: [phone]   06/05/2020, 11:17 AM  This note was partially dictated with voice recognition software. Similar sounding words can be transcribed inadequately or may not  be corrected upon review.

## 2020-06-27 ENCOUNTER — Other Ambulatory Visit: Payer: Self-pay | Admitting: "Endocrinology

## 2020-08-07 ENCOUNTER — Other Ambulatory Visit (HOSPITAL_COMMUNITY)
Admission: RE | Admit: 2020-08-07 | Discharge: 2020-08-07 | Disposition: A | Discharge status: Home / Self Care | Payer: Medicaid Other | Source: Ambulatory Visit | Attending: "Endocrinology | Admitting: "Endocrinology

## 2020-08-07 ENCOUNTER — Other Ambulatory Visit: Payer: Self-pay

## 2020-08-07 ENCOUNTER — Ambulatory Visit: Payer: Medicaid Other | Admitting: "Endocrinology

## 2020-08-07 DIAGNOSIS — E0500 Thyrotoxicosis with diffuse goiter without thyrotoxic crisis or storm: Secondary | ICD-10-CM | POA: Diagnosis present

## 2020-08-07 DIAGNOSIS — E0590 Thyrotoxicosis, unspecified without thyrotoxic crisis or storm: Secondary | ICD-10-CM | POA: Diagnosis not present

## 2020-08-07 LAB — TSH: TSH: <0.01 u[IU]/mL — ABNORMAL LOW (ref 0.350–4.500)

## 2020-08-07 LAB — T4, FREE: Free T4: 1.82 ng/dL — ABNORMAL HIGH (ref 0.61–1.12)

## 2020-08-08 LAB — T3, FREE: T3, Free: 8 pg/mL — ABNORMAL HIGH (ref 2.0–4.4)

## 2020-08-08 LAB — THYROID PEROXIDASE ANTIBODY: Thyroperoxidase Ab SerPl-aCnc: 246 IU/mL — ABNORMAL HIGH (ref 0–34)

## 2020-08-11 LAB — THYROGLOBULIN ANTIBODY: Thyroglobulin Antibody: 1.2 IU/mL — ABNORMAL HIGH (ref 0.0–0.9)

## 2020-08-13 ENCOUNTER — Encounter: Payer: Self-pay | Admitting: "Endocrinology

## 2020-08-13 ENCOUNTER — Other Ambulatory Visit: Payer: Self-pay

## 2020-08-13 ENCOUNTER — Ambulatory Visit (INDEPENDENT_AMBULATORY_CARE_PROVIDER_SITE_OTHER): Payer: Medicaid Other | Admitting: "Endocrinology

## 2020-08-13 VITALS — BP 160/102 | HR 92 | Ht 63.0 in | Wt 185.0 lb

## 2020-08-13 DIAGNOSIS — E0500 Thyrotoxicosis with diffuse goiter without thyrotoxic crisis or storm: Secondary | ICD-10-CM | POA: Diagnosis not present

## 2020-08-13 DIAGNOSIS — E0590 Thyrotoxicosis, unspecified without thyrotoxic crisis or storm: Secondary | ICD-10-CM | POA: Insufficient documentation

## 2020-08-13 DIAGNOSIS — I10 Essential (primary) hypertension: Secondary | ICD-10-CM | POA: Insufficient documentation

## 2020-08-13 MED ORDER — AMLODIPINE BESYLATE 10 MG PO TABS: 10.0000 mg | ORAL_TABLET | Freq: Every day | ORAL | 1 refills | Status: DC

## 2020-08-13 MED ORDER — PREDNISONE 10 MG PO TABS: 10.0000 mg | ORAL_TABLET | Freq: Every day | ORAL | 0 refills | Status: DC

## 2020-08-13 MED ORDER — HYDROCHLOROTHIAZIDE 25 MG PO TABS: 25.0000 mg | ORAL_TABLET | Freq: Every day | ORAL | 1 refills | Status: DC

## 2020-08-13 NOTE — Progress Notes (Signed)
08/13/2020     Endocrinology follow-up note   Subjective:    Patient ID: Lydia Norton, female    DOB: 12/15/1985, PCP Patient, No Pcp Per.   Past Medical History:  Diagnosis Date  . Cervicalgia   . Chronic pain syndrome   . GERD (gastroesophageal reflux disease)   . Hypertension     Past Surgical History:  Procedure Laterality Date  . HERNIA REPAIR    . TONSILLECTOMY      Social History   Socioeconomic History  . Marital status: Single    Spouse name: Not on file  . Number of children: Not on file  . Years of education: Not on file  . Highest education level: Not on file  Occupational History  . Not on file  Tobacco Use  . Smoking status: Current Every Day Smoker    Packs/day: 1.00    Types: Cigarettes  . Smokeless tobacco: Never Used  Substance and Sexual Activity  . Alcohol use: No  . Drug use: No  . Sexual activity: Not on file  Other Topics Concern  . Not on file  Social History Narrative  . Not on file   Social Determinants of Health   Financial Resource Strain: Not on file  Food Insecurity: Not on file  Transportation Needs: Not on file  Physical Activity: Not on file  Stress: Not on file  Social Connections: Not on file    Family History  Problem Relation Age of Onset  . Hypertension Mother   . Diabetes Father   . Hypertension Father   . Hyperlipidemia Father   . Heart attack Father   . Stroke Father   . Heart failure Father     Outpatient Encounter Medications as of 08/13/2020  Medication Sig  . hydrochlorothiazide (HYDRODIURIL) 25 MG tablet Take 1 tablet (25 mg total) by mouth daily.  . predniSONE (DELTASONE) 10 MG tablet Take 1 tablet (10 mg total) by mouth daily with breakfast.  . amLODipine (NORVASC) 10 MG tablet Take 1 tablet (10 mg total) by mouth daily.  Marland Kitchen HYDROcodone-acetaminophen (NORCO/VICODIN) 5-325 MG tablet Take 1 tablet by mouth every 6 (six) hours as needed.  . methimazole (TAPAZOLE) 10 MG tablet Take 1  tablet (10 mg total) by mouth 2 (two) times daily.  Marland Kitchen omeprazole (PRILOSEC) 40 MG capsule Take 40 mg by mouth daily.  . propranolol (INDERAL) 20 MG tablet Take 1 tablet (20 mg total) by mouth 2 (two) times daily.  . [DISCONTINUED] amLODipine (NORVASC) 5 MG tablet Take 5 mg by mouth daily.  . [DISCONTINUED] predniSONE (DELTASONE) 10 MG tablet Take 1 tablet (10 mg total) by mouth daily with breakfast. (Patient not taking: Reported on 08/13/2020)   No facility-administered encounter medications on file as of 08/13/2020.    ALLERGIES: Allergies  Allergen Reactions  . Lisinopril Swelling    VACCINATION STATUS:  There is no immunization history on file for this patient.   HPI  Lydia Norton is 34 y.o. female who presents today with a medical history as above. she was seen in consultation for hyperthyroidism in October 2021 requested by Trenda Moots, NP. Due to her presentation with severe thyrotoxicosis, she was kept on a higher dose of methimazole 10 mg p.o. twice daily, propranolol 20 mg p.o. twice daily, as well as short course of prednisone in order for her to achieve reasonable control of her thyroid to clear way for appropriate work-up.  She reports feeling better, she lost 5 more  pounds since last visit, prior to initiation of treatmentshe has been dealing with symptoms of weight loss of 100+ pounds, palpitations, tremors, and heat intolerance, sleep disturbance on and off for more than a year.  -Although they are still there, the symptoms have largely improved over the last several weeks.    -She did not undergo thyroid uptake and scan.  She underwent thyroid ultrasound which showed diffuse enlargement of her thyroid lobes with no discrete nodules.  she denies dysphagia, choking, shortness of breath, no recent voice change.    she has family history of thyroid dysfunction but denies family hx of thyroid cancer. she reports that she discovered her own goiter approximately 2  years ago.   she reports compliance with her methimazole 5 mg p.o. 3 times daily, not on beta-blockers, not on steroids.                            Review of systems  Constitutional: + weight loss, + fatigue, + subjective hyperthermia Eyes: no blurry vision, + xerophthalmia ENT: no sore throat, no nodules palpated in throat, no dysphagia/odynophagia, nor hoarseness Cardiovascular: no Chest Pain, no Shortness of Breath, ++  palpitations, no leg swelling Respiratory: no cough, no SOB Gastrointestinal: no Nausea, no Vomiting, no Diarhhea Musculoskeletal: no muscle/joint aches Skin: no rashes Neurological: + tremors, no numbness, no tingling, no dizziness Psychiatric: no depression, +  anxiety   Objective:    BP (!) 160/102   Pulse 92   Ht 5\' 3"  (1.6 m)   Wt 185 lb (83.9 kg)   BMI 32.77 kg/m   Wt Readings from Last 3 Encounters:  08/13/20 185 lb (83.9 kg)  06/05/20 190 lb (86.2 kg)  10/05/16 210 lb (95.3 kg)                                                Physical exam  Constitutional: Body mass index is 32.77 kg/m., not in acute distress, + anxious state of mind Eyes: PERRLA, EOMI, ++ exophthalmos ENT: moist mucous membranes, +  Thyromegaly, smooth, + bruit, no cervical lymphadenopathy Cardiovascular: + precordial activity, -tachycardic,  no Murmur/Rubs/Gallops Respiratory:  adequate breathing efforts, no gross chest deformity, Clear to auscultation bilaterally Gastrointestinal: abdomen soft, Non -tender, No distension, Bowel Sounds present Musculoskeletal: no gross deformities, strength intact in all four extremities Skin: moist, warm, no rashes Neurological: +  tremor with outstretched hands,  + Deep Tendon Reflexes  on both lower extremities.   CMP     Component Value Date/Time   NA 137 10/05/2016 1101   K 3.4 (L) 10/05/2016 1101   CL 104 10/05/2016 1101   CO2 26 10/05/2016 1101   GLUCOSE 93 10/05/2016 1101   BUN 10 02/22/2020 0000   CREATININE 0.3 (A) 02/22/2020  0000   CREATININE 0.61 10/05/2016 1101   CALCIUM 9.1 10/05/2016 1101   PROT 7.4 10/05/2016 1101   ALBUMIN 4.0 10/05/2016 1101   AST 22 10/05/2016 1101   ALT 32 10/05/2016 1101   ALKPHOS 49 10/05/2016 1101   BILITOT 0.4 10/05/2016 1101   GFRNONAA 146 02/22/2020 0000   GFRAA 169 02/22/2020 0000     CBC    Component Value Date/Time   WBC 8.4 10/05/2016 1101   RBC 4.84 10/05/2016 1101   HGB 14.1 10/05/2016 1101   HCT  40.6 10/05/2016 1101   PLT 278 10/05/2016 1101   MCV 83.9 10/05/2016 1101   MCH 29.1 10/05/2016 1101   MCHC 34.7 10/05/2016 1101   RDW 13.0 10/05/2016 1101   LYMPHSABS 3.0 07/06/2016 0039   MONOABS 0.6 07/06/2016 0039   EOSABS 0.2 07/06/2016 0039   BASOSABS 0.0 07/06/2016 0039     Diabetic Labs (most recent): Lab Results  Component Value Date   HGBA1C 4.9 02/22/2020    Lipid Panel     Component Value Date/Time   CHOL 194 02/22/2020 0000   TRIG 201 (A) 02/22/2020 0000   HDL 53 02/22/2020 0000   LDLCALC 109 02/22/2020 0000     Lab Results  Component Value Date   TSH <0.010 (L) 08/07/2020   TSH 0.01 (A) 02/22/2020   FREET4 1.82 (H) 08/07/2020     Thyroid ultrasound on May 13, 2020: Right lobe 6.5 x 3.1 x 3.2 cm, heterogeneous, vascular.  No nodules. Left lobe: 6.5 x 3.4 x 2.9 cm heterogeneous, vascular.  No nodules.   Assessment & Plan:   1. Hyperthyroidism 2. Graves disease  Her previsit labs are consistent with antithyroid antibodies suggesting autoimmune thyroid dysfunction likely Graves' disease as etiology for her hyperthyroidism.  At this time, she is advised to hold methimazole for 5 days and do thyroid uptake and scan to prepare her for thyroid ablation with I-131.  His work-up should be completed in 1 week, patient will return in 10 days for reevaluation. She will likely need I-131 thyroid ablation. In the meantime, she is advised to continue propanolol 20 mg twice  Daily, refilled her prednisone 10 mg p.o. daily at  breakfast.  She has uncontrolled hypertension. Advised to increase her amlodipine to 10 mg p.o. daily, added HCTZ 25 mg p.o. daily. She is allergic to lisinopril.   -Patient is urged to maintain close follow-up to complete the work-up and definitive treatment of her severe hypothyroidism to prevent her from thyroid storm and acute and chronic complications of thyrotoxicosis.    Patient is made aware of the high likelihood of post ablative hypothyroidism with subsequent need for lifelong thyroid hormone replacement. she  understands this outcome  and she is  willing to proceed.    In light of her diffusely enlarged vascular thyroid with no discrete nodules, treatment with I-131 will be effective to lower the size of her thyroid to manageable degree, making surgery unnecessary in her case.   Her current degree of thyroid hormone burden will complicate pregnancy.  She is advised to postpone all pregnancy until her thyroid is controlled to safe range.  She is advised to use effective contraceptive methods to achieve that.   -Patient is advised to maintain close follow up with her PCP  for primary care needs.      - Time spent on this patient care encounter:  30 minutes of which 50% was spent in  counseling and the rest reviewing  her current and  previous labs / studies and medications  doses and developing a plan for long term care. Lydia Norton  participated in the discussions, expressed understanding, and voiced agreement with the above plans.  All questions were answered to her satisfaction. she is encouraged to contact clinic should she have any questions or concerns prior to her return visit.   Follow up plan: Return in about 9 days (around 08/22/2020) for F/U with Thyroid Uptake and Scan.   Thank you for involving me in the care of this pleasant patient,  and I will continue to update you with her progress.  Marquis LunchGebre Emileigh Kellett, MD [facility] Phone: [phone]  Fax: [phone]   08/13/2020, 2:39 PM  This note was partially dictated with voice recognition software. Similar sounding words can be transcribed inadequately or may not  be corrected upon review.

## 2020-08-22 ENCOUNTER — Ambulatory Visit: Payer: Medicaid Other | Admitting: "Endocrinology

## 2020-08-27 ENCOUNTER — Encounter (HOSPITAL_COMMUNITY)
Admission: RE | Admit: 2020-08-27 | Discharge: 2020-08-27 | Disposition: A | Discharge status: Home / Self Care | Payer: Managed Care, Other (non HMO) | Source: Ambulatory Visit | Attending: "Endocrinology | Admitting: "Endocrinology

## 2020-08-27 ENCOUNTER — Encounter (HOSPITAL_COMMUNITY): Payer: Self-pay

## 2020-08-27 ENCOUNTER — Other Ambulatory Visit: Payer: Self-pay

## 2020-08-27 DIAGNOSIS — E0590 Thyrotoxicosis, unspecified without thyrotoxic crisis or storm: Secondary | ICD-10-CM | POA: Diagnosis present

## 2020-08-27 MED ORDER — SODIUM IODIDE I-123 7.4 MBQ CAPS
500.0000 | ORAL_CAPSULE | Freq: Once | ORAL | Status: AC
  Administered 2020-08-27: 411 via ORAL

## 2020-08-28 ENCOUNTER — Encounter: Payer: Self-pay | Admitting: "Endocrinology

## 2020-08-28 ENCOUNTER — Encounter (HOSPITAL_COMMUNITY)
Admission: RE | Admit: 2020-08-28 | Discharge: 2020-08-28 | Disposition: A | Discharge status: Home / Self Care | Payer: Managed Care, Other (non HMO) | Source: Ambulatory Visit | Attending: "Endocrinology | Admitting: "Endocrinology

## 2020-08-28 ENCOUNTER — Telehealth (INDEPENDENT_AMBULATORY_CARE_PROVIDER_SITE_OTHER): Payer: Medicaid Other | Admitting: "Endocrinology

## 2020-08-28 ENCOUNTER — Other Ambulatory Visit: Payer: Self-pay

## 2020-08-28 VITALS — Ht 63.0 in | Wt 180.0 lb

## 2020-08-28 DIAGNOSIS — E0500 Thyrotoxicosis with diffuse goiter without thyrotoxic crisis or storm: Secondary | ICD-10-CM | POA: Diagnosis not present

## 2020-08-28 DIAGNOSIS — E0590 Thyrotoxicosis, unspecified without thyrotoxic crisis or storm: Secondary | ICD-10-CM | POA: Diagnosis not present

## 2020-08-28 MED ORDER — PREDNISONE 10 MG PO TABS
10.0000 mg | ORAL_TABLET | Freq: Every day | ORAL | 0 refills | Status: DC
Start: 2020-08-28 — End: 2020-10-30

## 2020-08-28 NOTE — Progress Notes (Signed)
08/28/2020                                    Endocrinology Telehealth Visit Follow up Note -During COVID -19 Pandemic  This visit type was conducted  via phone due to national recommendations for restrictions regarding the COVID-19 Pandemic  in an effort to limit this patient's exposure and mitigate transmission of the corona virus.   I connected with Lydia Norton on 08/28/2020   by telephone and verified that I am speaking with the correct person using two identifiers. Lydia Norton, [birth year]. she has verbally consented to this visit.  I was in my office and patient was in her residence. No other persons were with me during the encounter. All issues noted in this document were discussed and addressed. The format was not optimal for physical exam.    Subjective:    Patient ID: Lydia Norton, female    DOB: 11-04-1985, PCP Patient, No Pcp Per.   Past Medical History:  Diagnosis Date  . Cervicalgia   . Chronic pain syndrome   . GERD (gastroesophageal reflux disease)   . Hypertension     Past Surgical History:  Procedure Laterality Date  . HERNIA REPAIR    . TONSILLECTOMY      Social History   Socioeconomic History  . Marital status: Single    Spouse name: Not on file  . Number of children: Not on file  . Years of education: Not on file  . Highest education level: Not on file  Occupational History  . Not on file  Tobacco Use  . Smoking status: Current Every Day Smoker    Packs/day: 1.00    Types: Cigarettes  . Smokeless tobacco: Never Used  Substance and Sexual Activity  . Alcohol use: No  . Drug use: No  . Sexual activity: Not on file  Other Topics Concern  . Not on file  Social History Narrative  . Not on file   Social Determinants of Health   Financial Resource Strain: Not on file  Food Insecurity: Not on file  Transportation Needs: Not on file  Physical Activity: Not on file  Stress: Not on file  Social Connections: Not on file     Family History  Problem Relation Age of Onset  . Hypertension Mother   . Diabetes Father   . Hypertension Father   . Hyperlipidemia Father   . Heart attack Father   . Stroke Father   . Heart failure Father     Outpatient Encounter Medications as of 08/28/2020  Medication Sig  . amLODipine (NORVASC) 10 MG tablet Take 1 tablet (10 mg total) by mouth daily.  . hydrochlorothiazide (HYDRODIURIL) 25 MG tablet Take 1 tablet (25 mg total) by mouth daily.  Marland Kitchen HYDROcodone-acetaminophen (NORCO/VICODIN) 5-325 MG tablet Take 1 tablet by mouth every 6 (six) hours as needed.  Marland Kitchen omeprazole (PRILOSEC) 40 MG capsule Take 40 mg by mouth daily.  . predniSONE (DELTASONE) 10 MG tablet Take 1 tablet (10 mg total) by mouth daily with breakfast.  . propranolol (INDERAL) 20 MG tablet Take 1 tablet (20 mg total) by mouth 2 (two) times daily.  . [DISCONTINUED] methimazole (TAPAZOLE) 10 MG tablet Take 1 tablet (10 mg total) by mouth 2 (two) times daily. (Patient not taking: Reported on 08/28/2020)  . [DISCONTINUED] predniSONE (DELTASONE) 10 MG tablet Take 1 tablet (10 mg total) by  mouth daily with breakfast.   No facility-administered encounter medications on file as of 08/28/2020.    ALLERGIES: Allergies  Allergen Reactions  . Lisinopril Swelling    VACCINATION STATUS:  There is no immunization history on file for this patient.   HPI  Lydia Norton is 35 y.o. female who is being engaged in telehealth in follow-up for hyperthyroidism.    Due to her presentation with severe thyrotoxicosis, she was kept on a higher dose of methimazole 10 mg p.o. twice daily, propranolol 20 mg p.o. twice daily, as well as short course of prednisone in order for her to achieve reasonable control of her thyroid to clear way for appropriate work-up.  She reports feeling better.  After methimazole was discontinued for 5 days, she underwent thyroid uptake and scan which confirmed 66% uptake in 24 hours which was uniform and  consistent with Graves' disease.  she has been dealing with symptoms of unintended weight loss of 100+ pounds, palpitations, tremors, and heat intolerance, sleep disturbance on and off for more than a year.  -Although they are still there, the symptoms have largely improved over the last several weeks.    She underwent thyroid ultrasound which showed diffuse enlargement of her thyroid lobes with no discrete nodules.  she denies dysphagia, choking, shortness of breath, no recent voice change.    she has family history of thyroid dysfunction but denies family hx of thyroid cancer. she reports that she discovered her own goiter approximately 2 years ago.   she reports compliance with her methimazole 5 mg p.o. 3 times daily, not on beta-blockers, not on steroids.                            Review of systems Limited as above.  Objective:    Ht 5\' 3"  (1.6 m)   Wt 180 lb (81.6 kg)   BMI 31.89 kg/m   Wt Readings from Last 3 Encounters:  08/28/20 180 lb (81.6 kg)  08/13/20 185 lb (83.9 kg)  06/05/20 190 lb (86.2 kg)                                                Physical exam    CMP     Component Value Date/Time   NA 137 10/05/2016 1101   K 3.4 (L) 10/05/2016 1101   CL 104 10/05/2016 1101   CO2 26 10/05/2016 1101   GLUCOSE 93 10/05/2016 1101   BUN 10 02/22/2020 0000   CREATININE 0.3 (A) 02/22/2020 0000   CREATININE 0.61 10/05/2016 1101   CALCIUM 9.1 10/05/2016 1101   PROT 7.4 10/05/2016 1101   ALBUMIN 4.0 10/05/2016 1101   AST 22 10/05/2016 1101   ALT 32 10/05/2016 1101   ALKPHOS 49 10/05/2016 1101   BILITOT 0.4 10/05/2016 1101   GFRNONAA 146 02/22/2020 0000   GFRAA 169 02/22/2020 0000     CBC    Component Value Date/Time   WBC 8.4 10/05/2016 1101   RBC 4.84 10/05/2016 1101   HGB 14.1 10/05/2016 1101   HCT 40.6 10/05/2016 1101   PLT 278 10/05/2016 1101   MCV 83.9 10/05/2016 1101   MCH 29.1 10/05/2016 1101   MCHC 34.7 10/05/2016 1101   RDW 13.0 10/05/2016 1101    LYMPHSABS 3.0 07/06/2016 0039   MONOABS 0.6 07/06/2016  0039   EOSABS 0.2 07/06/2016 0039   BASOSABS 0.0 07/06/2016 0039     Diabetic Labs (most recent): Lab Results  Component Value Date   HGBA1C 4.9 02/22/2020    Lipid Panel     Component Value Date/Time   CHOL 194 02/22/2020 0000   TRIG 201 (A) 02/22/2020 0000   HDL 53 02/22/2020 0000   LDLCALC 109 02/22/2020 0000     Lab Results  Component Value Date   TSH <0.010 (L) 08/07/2020   TSH 0.01 (A) 02/22/2020   FREET4 1.82 (H) 08/07/2020     Thyroid ultrasound on May 13, 2020: Right lobe 6.5 x 3.1 x 3.2 cm, heterogeneous, vascular.  No nodules. Left lobe: 6.5 x 3.4 x 2.9 cm heterogeneous, vascular.  No nodules.  Thyroid uptake and scan on August 28, 2020 FINDINGS: There is diffusely increased radiotracer uptake within both lobes of the thyroid gland. No dominant hot or cold nodule.  4 hour I-123 uptake = 61.4% (normal 5-20%) 24 hour I-123 uptake = 66% (normal 10-30%)  IMPRESSION: 1. Markedly elevated 4 hour and 24 hour radioactive iodine uptake. Patient's hyperthyroidism is likely due to Graves disease which may be amendable to therapy with radiolabeled iodine.  Assessment & Plan:   1. Hyperthyroidism 2. Graves disease  Her preceding work-up confirms primary hyperthyroidism from Graves' disease.  She was treated with methimazole for the last several weeks to achieve safe range thyroid function to perform thyroid uptake and scan.    At this time, she will be considered for definitive antithyroid intervention with radioactive iodine thyroid ablation.    This procedure was previously discussed in detail with her, discussed again today.  -She will continue to benefit from beta-blocker, and prednisone.  This medication will be refilled.  Patient is made aware of the high likelihood of post ablative hypothyroidism with subsequent need for lifelong thyroid hormone replacement. she  understands this outcome   and she is  willing to proceed.   She has uncontrolled hypertension. Advised to increase her amlodipine to 10 mg p.o. daily, added HCTZ 25 mg p.o. daily. She is allergic to lisinopril.   Her current degree of thyroid hormone burden will complicate pregnancy.  She is advised to postpone all pregnancy until her thyroid is controlled to safe range.  She is advised to use effective contraceptive methods to achieve that.   -Patient is advised to maintain close follow up with her PCP  for primary care needs.     - Time spent on this patient care encounter:  20 minutes of which 50% was spent in  counseling and the rest reviewing  her current and  previous labs / studies and medications  doses and developing a plan for long term care. Lydia Norton  participated in the discussions, expressed understanding, and voiced agreement with the above plans.  All questions were answered to her satisfaction. she is encouraged to contact clinic should she have any questions or concerns prior to her return visit.   Follow up plan: Return in about 9 weeks (around 10/30/2020) for F/U with Labs after I131 Therapy.   Thank you for involving me in the care of this pleasant patient, and I will continue to update you with her progress.  Marquis Lunch, MD [facility] Phone: [phone]  Fax: [phone]   08/28/2020, 5:33 PM  This note was partially dictated with voice recognition software. Similar sounding words can be transcribed inadequately or may not  be  corrected upon review.

## 2020-09-05 ENCOUNTER — Ambulatory Visit: Payer: Medicaid Other | Admitting: "Endocrinology

## 2020-09-12 ENCOUNTER — Encounter (HOSPITAL_COMMUNITY): Payer: Self-pay

## 2020-09-12 ENCOUNTER — Encounter (HOSPITAL_COMMUNITY): Admission: RE | Admit: 2020-09-12 | Payer: Medicaid Other | Source: Ambulatory Visit

## 2020-09-12 ENCOUNTER — Other Ambulatory Visit: Payer: Self-pay

## 2020-09-12 ENCOUNTER — Ambulatory Visit (HOSPITAL_COMMUNITY)
Admission: RE | Admit: 2020-09-12 | Discharge: 2020-09-12 | Disposition: A | Discharge status: Home / Self Care | Payer: Medicaid Other | Source: Ambulatory Visit | Attending: "Endocrinology | Admitting: "Endocrinology

## 2020-09-12 DIAGNOSIS — E0500 Thyrotoxicosis with diffuse goiter without thyrotoxic crisis or storm: Secondary | ICD-10-CM | POA: Diagnosis not present

## 2020-09-12 LAB — PREGNANCY, URINE: Preg Test, Ur: NEGATIVE

## 2020-10-17 ENCOUNTER — Other Ambulatory Visit: Payer: Self-pay | Admitting: "Endocrinology

## 2020-10-17 NOTE — Telephone Encounter (Signed)
Please advise 

## 2020-10-30 ENCOUNTER — Encounter: Payer: Self-pay | Admitting: "Endocrinology

## 2020-10-30 ENCOUNTER — Ambulatory Visit (INDEPENDENT_AMBULATORY_CARE_PROVIDER_SITE_OTHER): Payer: Medicaid Other | Admitting: "Endocrinology

## 2020-10-30 ENCOUNTER — Other Ambulatory Visit: Payer: Self-pay

## 2020-10-30 VITALS — BP 112/58 | HR 80 | Ht 63.0 in | Wt 190.2 lb

## 2020-10-30 DIAGNOSIS — E0590 Thyrotoxicosis, unspecified without thyrotoxic crisis or storm: Secondary | ICD-10-CM | POA: Diagnosis not present

## 2020-10-30 DIAGNOSIS — E0500 Thyrotoxicosis with diffuse goiter without thyrotoxic crisis or storm: Secondary | ICD-10-CM | POA: Diagnosis not present

## 2020-10-30 LAB — TSH: TSH: <0.005 u[IU]/mL — ABNORMAL LOW (ref 0.450–4.500)

## 2020-10-30 LAB — T3, FREE: T3, Free: 8.9 pg/mL — ABNORMAL HIGH (ref 2.0–4.4)

## 2020-10-30 LAB — T4, FREE: Free T4: 3.38 ng/dL — ABNORMAL HIGH (ref 0.82–1.77)

## 2020-10-30 NOTE — Progress Notes (Signed)
10/30/2020     Endocrinology follow-up note  Subjective:    Patient ID: Lydia Norton, female    DOB: 01-Aug-1986, PCP Patient, No Pcp Per.   Past Medical History:  Diagnosis Date  . Cervicalgia   . Chronic pain syndrome   . GERD (gastroesophageal reflux disease)   . Hypertension     Past Surgical History:  Procedure Laterality Date  . HERNIA REPAIR    . TONSILLECTOMY      Social History   Socioeconomic History  . Marital status: Single    Spouse name: Not on file  . Number of children: Not on file  . Years of education: Not on file  . Highest education level: Not on file  Occupational History  . Not on file  Tobacco Use  . Smoking status: Current Every Day Smoker    Packs/day: 1.00    Types: Cigarettes  . Smokeless tobacco: Never Used  Substance and Sexual Activity  . Alcohol use: No  . Drug use: No  . Sexual activity: Not on file  Other Topics Concern  . Not on file  Social History Narrative  . Not on file   Social Determinants of Health   Financial Resource Strain: Not on file  Food Insecurity: Not on file  Transportation Needs: Not on file  Physical Activity: Not on file  Stress: Not on file  Social Connections: Not on file    Family History  Problem Relation Age of Onset  . Hypertension Mother   . Diabetes Father   . Hypertension Father   . Hyperlipidemia Father   . Heart attack Father   . Stroke Father   . Heart failure Father     Outpatient Encounter Medications as of 10/30/2020  Medication Sig  . amLODipine (NORVASC) 10 MG tablet Take 1 tablet (10 mg total) by mouth daily.  . hydrochlorothiazide (HYDRODIURIL) 25 MG tablet Take 1 tablet (25 mg total) by mouth daily.  Marland Kitchen HYDROcodone-acetaminophen (NORCO/VICODIN) 5-325 MG tablet Take 1 tablet by mouth every 6 (six) hours as needed.  Marland Kitchen omeprazole (PRILOSEC) 40 MG capsule Take 40 mg by mouth daily.  . propranolol (INDERAL) 20 MG tablet TAKE 1 TABLET BY MOUTH TWICE A DAY  .  [DISCONTINUED] predniSONE (DELTASONE) 10 MG tablet Take 1 tablet (10 mg total) by mouth daily with breakfast.   No facility-administered encounter medications on file as of 10/30/2020.    ALLERGIES: Allergies  Allergen Reactions  . Lisinopril Swelling    VACCINATION STATUS:  There is no immunization history on file for this patient.   HPI  LISA-MARIE RUEGER is 35 y.o. female who is being engaged in telehealth in follow-up for hyperthyroidism.    Due to her presentation with severe thyrotoxicosis, she was treated with high-dose methimazole until she achieved reasonable control before uptake and scan was performed which showed significant uptake consistent with Graves' disease, uniform uptake of 66% in 24 hours.   She was then offered radioactive iodine thyroid ablation which was administered on September 12, 2020.  Her thyroid function test on October 29, 2020 were such that she was still thyrotoxic.  Clinically she has improved with 10 pound weight gain.  She remains on propanolol 20 mg twice daily.   Her previous symptoms have largely resolved.  She denies palpitations, tremors, heat intolerance.  She sleeps better, has more consistent energy.  -Before her last visit she presented with a history consistent with 100+ pounds of weight loss.  She  underwent thyroid ultrasound which showed diffuse enlargement of her thyroid lobes with no discrete nodules.  she denies dysphagia, choking, shortness of breath, no recent voice change.    she has family history of thyroid dysfunction but denies family hx of thyroid cancer. she reports that she discovered her own goiter approximately 2 years ago.   she reports compliance with her methimazole 5 mg p.o. 3 times daily, not on beta-blockers, not on steroids.                            Review of systems Limited as above.  Objective:    BP (!) 112/58   Pulse 80   Ht 5\' 3"  (1.6 m)   Wt 190 lb 3.2 oz (86.3 kg)   BMI 33.69 kg/m   Wt Readings from Last  3 Encounters:  10/30/20 190 lb 3.2 oz (86.3 kg)  08/28/20 180 lb (81.6 kg)  08/13/20 185 lb (83.9 kg)                                                Physical exam    CMP     Component Value Date/Time   NA 137 10/05/2016 1101   K 3.4 (L) 10/05/2016 1101   CL 104 10/05/2016 1101   CO2 26 10/05/2016 1101   GLUCOSE 93 10/05/2016 1101   BUN 10 02/22/2020 0000   CREATININE 0.3 (A) 02/22/2020 0000   CREATININE 0.61 10/05/2016 1101   CALCIUM 9.1 10/05/2016 1101   PROT 7.4 10/05/2016 1101   ALBUMIN 4.0 10/05/2016 1101   AST 22 10/05/2016 1101   ALT 32 10/05/2016 1101   ALKPHOS 49 10/05/2016 1101   BILITOT 0.4 10/05/2016 1101   GFRNONAA 146 02/22/2020 0000   GFRAA 169 02/22/2020 0000     CBC    Component Value Date/Time   WBC 8.4 10/05/2016 1101   RBC 4.84 10/05/2016 1101   HGB 14.1 10/05/2016 1101   HCT 40.6 10/05/2016 1101   PLT 278 10/05/2016 1101   MCV 83.9 10/05/2016 1101   MCH 29.1 10/05/2016 1101   MCHC 34.7 10/05/2016 1101   RDW 13.0 10/05/2016 1101   LYMPHSABS 3.0 07/06/2016 0039   MONOABS 0.6 07/06/2016 0039   EOSABS 0.2 07/06/2016 0039   BASOSABS 0.0 07/06/2016 0039     Diabetic Labs (most recent): Lab Results  Component Value Date   HGBA1C 4.9 02/22/2020    Lipid Panel     Component Value Date/Time   CHOL 194 02/22/2020 0000   TRIG 201 (A) 02/22/2020 0000   HDL 53 02/22/2020 0000   LDLCALC 109 02/22/2020 0000     Lab Results  Component Value Date   TSH <0.005 (L) 10/29/2020   TSH <0.010 (L) 08/07/2020   TSH 0.01 (A) 02/22/2020   FREET4 3.38 (H) 10/29/2020   FREET4 1.82 (H) 08/07/2020     Thyroid ultrasound on May 13, 2020: Right lobe 6.5 x 3.1 x 3.2 cm, heterogeneous, vascular.  No nodules. Left lobe: 6.5 x 3.4 x 2.9 cm heterogeneous, vascular.  No nodules.  Thyroid uptake and scan on August 28, 2020 FINDINGS: There is diffusely increased radiotracer uptake within both lobes of the thyroid gland. No dominant hot or cold  nodule.  4 hour I-123 uptake = 61.4% (normal 5-20%) 24 hour I-123 uptake =  66% (normal 10-30%)  IMPRESSION: 1. Markedly elevated 4 hour and 24 hour radioactive iodine uptake. Patient's hyperthyroidism is likely due to Graves disease which may be amendable to therapy with radiolabeled iodine.   September 12, 2020 RADIOPHARMACEUTICALS:  15.2 mCi I-131 sodium iodide orally  IMPRESSION: Per oral administration of I-131 sodium iodide for the treatment of hyperthyroidism.  Assessment & Plan:   1. Hyperthyroidism 2. Graves disease She is status post RAI thyroid ablation on September 12, 2020.  Her previsit labs did not show hypothyroidism.  She will not be initiated on thyroid hormone replacement yet.  She will return in 9 weeks with repeat thyroid function test. She is advised to discontinue prednisone.  She will continue to finish her propanolol by taking 20 mg p.o. daily lowering it from twice daily.  Patient is made aware of the high likelihood of post ablative hypothyroidism with subsequent need for lifelong thyroid hormone replacement.  She has uncontrolled hypertension. Advised to increase her amlodipine to 10 mg p.o. daily, added HCTZ 25 mg p.o. daily. She is allergic to lisinopril.  She was told avoid her weight coming back. - she acknowledges that there is a room for improvement in her food and drink choices. - Suggestion is made for her to avoid simple carbohydrates  from her diet including Cakes, Sweet Desserts, Ice Cream, Soda (diet and regular), Sweet Tea, Candies, Chips, Cookies, Store Bought Juices, Alcohol in Excess of  1-2 drinks a day, Artificial Sweeteners,  Coffee Creamer, and "Sugar-free" Products, Lemonade. This will help patient to have more stable blood glucose profile and potentially avoid unintended weight gain.    -Patient is advised to maintain close follow up with her PCP  for primary care needs.     - Time spent on this patient care encounter:  30 minutes  of which 50% was spent in  counseling and the rest reviewing  her current and  previous labs / studies and medications  doses and developing a plan for long term care, and documenting this care. Lydia Norton  participated in the discussions, expressed understanding, and voiced agreement with the above plans.  All questions were answered to her satisfaction. she is encouraged to contact clinic should she have any questions or concerns prior to her return visit.    Follow up plan: Return in about 9 weeks (around 01/01/2021) for F/U with Pre-visit Labs.   Thank you for involving me in the care of this pleasant patient, and I will continue to update you with her progress.  Marquis Lunch, MD [facility] Phone: [phone]  Fax: [phone]   10/30/2020, 1:33 PM  This note was partially dictated with voice recognition software. Similar sounding words can be transcribed inadequately or may not  be corrected upon review.

## 2020-12-26 ENCOUNTER — Other Ambulatory Visit: Payer: Self-pay

## 2020-12-26 ENCOUNTER — Other Ambulatory Visit (HOSPITAL_COMMUNITY)
Admission: RE | Admit: 2020-12-26 | Discharge: 2020-12-26 | Disposition: A | Discharge status: Home / Self Care | Payer: Managed Care, Other (non HMO) | Source: Ambulatory Visit | Attending: "Endocrinology | Admitting: "Endocrinology

## 2020-12-26 DIAGNOSIS — E0590 Thyrotoxicosis, unspecified without thyrotoxic crisis or storm: Secondary | ICD-10-CM | POA: Insufficient documentation

## 2020-12-26 DIAGNOSIS — E0500 Thyrotoxicosis with diffuse goiter without thyrotoxic crisis or storm: Secondary | ICD-10-CM | POA: Insufficient documentation

## 2020-12-26 LAB — T4, FREE: Free T4: 1.94 ng/dL — ABNORMAL HIGH (ref 0.61–1.12)

## 2020-12-26 LAB — TSH: TSH: <0.01 u[IU]/mL — ABNORMAL LOW (ref 0.350–4.500)

## 2020-12-27 LAB — T3, FREE: T3, Free: 9.3 pg/mL — ABNORMAL HIGH (ref 2.0–4.4)

## 2021-01-01 ENCOUNTER — Encounter: Payer: Self-pay | Admitting: "Endocrinology

## 2021-01-01 ENCOUNTER — Other Ambulatory Visit: Payer: Self-pay

## 2021-01-01 ENCOUNTER — Ambulatory Visit (INDEPENDENT_AMBULATORY_CARE_PROVIDER_SITE_OTHER): Payer: Medicaid Other | Admitting: "Endocrinology

## 2021-01-01 VITALS — BP 133/77 | HR 88 | Ht 63.0 in | Wt 197.0 lb

## 2021-01-01 DIAGNOSIS — E0590 Thyrotoxicosis, unspecified without thyrotoxic crisis or storm: Secondary | ICD-10-CM | POA: Diagnosis not present

## 2021-01-01 DIAGNOSIS — E0500 Thyrotoxicosis with diffuse goiter without thyrotoxic crisis or storm: Secondary | ICD-10-CM

## 2021-01-01 NOTE — Progress Notes (Signed)
01/01/2021     Endocrinology follow-up note  Subjective:    Patient ID: Lydia Norton, female    DOB: February 17, 1986, PCP Patient, No Pcp Per (Inactive).   Past Medical History:  Diagnosis Date  . Cervicalgia   . Chronic pain syndrome   . GERD (gastroesophageal reflux disease)   . Hypertension     Past Surgical History:  Procedure Laterality Date  . HERNIA REPAIR    . TONSILLECTOMY      Social History   Socioeconomic History  . Marital status: Single    Spouse name: Not on file  . Number of children: Not on file  . Years of education: Not on file  . Highest education level: Not on file  Occupational History  . Not on file  Tobacco Use  . Smoking status: Current Every Day Smoker    Packs/day: 1.00    Types: Cigarettes  . Smokeless tobacco: Never Used  Substance and Sexual Activity  . Alcohol use: No  . Drug use: No  . Sexual activity: Not on file  Other Topics Concern  . Not on file  Social History Narrative  . Not on file   Social Determinants of Health   Financial Resource Strain: Not on file  Food Insecurity: Not on file  Transportation Needs: Not on file  Physical Activity: Not on file  Stress: Not on file  Social Connections: Not on file    Family History  Problem Relation Age of Onset  . Hypertension Mother   . Diabetes Father   . Hypertension Father   . Hyperlipidemia Father   . Heart attack Father   . Stroke Father   . Heart failure Father     Outpatient Encounter Medications as of 01/01/2021  Medication Sig  . amLODipine (NORVASC) 10 MG tablet Take 1 tablet (10 mg total) by mouth daily.  . hydrochlorothiazide (HYDRODIURIL) 25 MG tablet Take 1 tablet (25 mg total) by mouth daily.  Marland Kitchen HYDROcodone-acetaminophen (NORCO/VICODIN) 5-325 MG tablet Take 1 tablet by mouth every 6 (six) hours as needed.  Marland Kitchen omeprazole (PRILOSEC) 40 MG capsule Take 40 mg by mouth daily.  . [DISCONTINUED] propranolol (INDERAL) 20 MG tablet TAKE 1 TABLET BY  MOUTH TWICE A DAY   No facility-administered encounter medications on file as of 01/01/2021.    ALLERGIES: Allergies  Allergen Reactions  . Lisinopril Swelling    VACCINATION STATUS:  There is no immunization history on file for this patient.   HPI  Lydia Norton is 35 y.o. female who is being engaged in telehealth in follow-up for hyperthyroidism.    Due to her presentation with severe thyrotoxicosis, she was treated with high-dose methimazole until she achieved reasonable control before uptake and scan was performed which showed significant uptake consistent with Graves' disease, uniform uptake of 66% in 24 hours.   She was then offered radioactive iodine thyroid ablation which was administered on September 12, 2020.  Her thyroid function test on Dec 26, 2020 were such that she was still thyrotoxic.  Clinically she has improved with 17 pound weight gain.  She remains on propanolol 20 mg once daily.   Her previous symptoms have largely resolved.  She denies palpitations, tremors, heat intolerance.  She sleeps better, has more consistent energy.  -Before her last visit she presented with a history consistent with 100+ pounds of weight loss.  She underwent thyroid ultrasound which showed diffuse enlargement of her thyroid lobes with no discrete nodules.  she  denies dysphagia, choking, shortness of breath, no recent voice change.    she has family history of thyroid dysfunction but denies family hx of thyroid cancer. she reports that she discovered her own goiter approximately 2 years ago.   she reports compliance with her methimazole 5 mg p.o. 3 times daily, not on beta-blockers, not on steroids.                            Review of systems Limited as above.  Objective:    BP 133/77   Pulse 88   Ht 5\' 3"  (1.6 m)   Wt 197 lb (89.4 kg)   BMI 34.90 kg/m   Wt Readings from Last 3 Encounters:  01/01/21 197 lb (89.4 kg)  10/30/20 190 lb 3.2 oz (86.3 kg)  08/28/20 180 lb (81.6 kg)                                                 Physical exam    CMP     Component Value Date/Time   NA 137 10/05/2016 1101   K 3.4 (L) 10/05/2016 1101   CL 104 10/05/2016 1101   CO2 26 10/05/2016 1101   GLUCOSE 93 10/05/2016 1101   BUN 10 02/22/2020 0000   CREATININE 0.3 (A) 02/22/2020 0000   CREATININE 0.61 10/05/2016 1101   CALCIUM 9.1 10/05/2016 1101   PROT 7.4 10/05/2016 1101   ALBUMIN 4.0 10/05/2016 1101   AST 22 10/05/2016 1101   ALT 32 10/05/2016 1101   ALKPHOS 49 10/05/2016 1101   BILITOT 0.4 10/05/2016 1101   GFRNONAA 146 02/22/2020 0000   GFRAA 169 02/22/2020 0000     CBC    Component Value Date/Time   WBC 8.4 10/05/2016 1101   RBC 4.84 10/05/2016 1101   HGB 14.1 10/05/2016 1101   HCT 40.6 10/05/2016 1101   PLT 278 10/05/2016 1101   MCV 83.9 10/05/2016 1101   MCH 29.1 10/05/2016 1101   MCHC 34.7 10/05/2016 1101   RDW 13.0 10/05/2016 1101   LYMPHSABS 3.0 07/06/2016 0039   MONOABS 0.6 07/06/2016 0039   EOSABS 0.2 07/06/2016 0039   BASOSABS 0.0 07/06/2016 0039     Diabetic Labs (most recent): Lab Results  Component Value Date   HGBA1C 4.9 02/22/2020    Lipid Panel     Component Value Date/Time   CHOL 194 02/22/2020 0000   TRIG 201 (A) 02/22/2020 0000   HDL 53 02/22/2020 0000   LDLCALC 109 02/22/2020 0000     Lab Results  Component Value Date   TSH <0.010 (L) 12/26/2020   TSH <0.005 (L) 10/29/2020   TSH <0.010 (L) 08/07/2020   TSH 0.01 (A) 02/22/2020   FREET4 1.94 (H) 12/26/2020   FREET4 3.38 (H) 10/29/2020   FREET4 1.82 (H) 08/07/2020     Thyroid ultrasound on May 13, 2020: Right lobe 6.5 x 3.1 x 3.2 cm, heterogeneous, vascular.  No nodules. Left lobe: 6.5 x 3.4 x 2.9 cm heterogeneous, vascular.  No nodules.  Thyroid uptake and scan on August 28, 2020 FINDINGS: There is diffusely increased radiotracer uptake within both lobes of the thyroid gland. No dominant hot or cold nodule.  4 hour I-123 uptake = 61.4% (normal  5-20%) 24 hour I-123 uptake = 66% (normal 10-30%)  IMPRESSION: 1. Markedly elevated  4 hour and 24 hour radioactive iodine uptake. Patient's hyperthyroidism is likely due to Graves disease which may be amendable to therapy with radiolabeled iodine.   September 12, 2020 RADIOPHARMACEUTICALS:  15.2 mCi I-131 sodium iodide orally  IMPRESSION: Per oral administration of I-131 sodium iodide for the treatment of hyperthyroidism.  Assessment & Plan:   1. Hyperthyroidism 2. Graves disease She is status post RAI thyroid ablation on September 12, 2020.  Her previsit labs did not show hypothyroidism yet.   She will not be initiated on thyroid hormone replacement yet.  She will return in 6 weeks with repeat thyroid function test. She is advised to discontinue prednisone.  She is advised to discontinue  propanolol .  Patient is made aware of the high likelihood of post ablative hypothyroidism with subsequent need for lifelong thyroid hormone replacement.  She has uncontrolled hypertension. Advised to increase her amlodipine to 10 mg p.o. daily, added HCTZ 25 mg p.o. daily. She is allergic to lisinopril.  - she acknowledges that there is a room for improvement in her food and drink choices. - Suggestion is made for her to avoid simple carbohydrates  from her diet including Cakes, Sweet Desserts, Ice Cream, Soda (diet and regular), Sweet Tea, Candies, Chips, Cookies, Store Bought Juices, Alcohol in Excess of  1-2 drinks a day, Artificial Sweeteners,  Coffee Creamer, and "Sugar-free" Products, Lemonade. This will help patient to have more stable blood glucose profile and potentially avoid unintended weight gain.      -Patient is advised to maintain close follow up with her PCP  for primary care needs.     I spent 25 minutes in the care of the patient today including review of labs from Thyroid Function, CMP, and other relevant labs ; imaging/biopsy records (current and previous including  abstractions from other facilities); face-to-face time discussing  her lab results and symptoms, medications doses, her options of short and long term treatment based on the latest standards of care / guidelines;   and documenting the encounter.  Lydia Norton  participated in the discussions, expressed understanding, and voiced agreement with the above plans.  All questions were answered to her satisfaction. she is encouraged to contact clinic should she have any questions or concerns prior to her return visit.    Follow up plan: Return in about 5 weeks (around 02/05/2021) for F/U with Pre-visit Labs.   Thank you for involving me in the care of this pleasant patient, and I will continue to update you with her progress.  Marquis Lunch, MD [facility] Phone: [phone]  Fax: [phone]   01/01/2021, 10:44 PM  This note was partially dictated with voice recognition software. Similar sounding words can be transcribed inadequately or may not  be corrected upon review.

## 2021-01-28 ENCOUNTER — Other Ambulatory Visit (HOSPITAL_COMMUNITY)
Admission: RE | Admit: 2021-01-28 | Discharge: 2021-01-28 | Disposition: A | Discharge status: Home / Self Care | Payer: Managed Care, Other (non HMO) | Source: Ambulatory Visit | Attending: "Endocrinology | Admitting: "Endocrinology

## 2021-01-28 DIAGNOSIS — E0590 Thyrotoxicosis, unspecified without thyrotoxic crisis or storm: Secondary | ICD-10-CM | POA: Diagnosis not present

## 2021-01-28 LAB — TSH: TSH: <0.01 u[IU]/mL — ABNORMAL LOW (ref 0.350–4.500)

## 2021-01-28 LAB — T4, FREE: Free T4: 2.05 ng/dL — ABNORMAL HIGH (ref 0.61–1.12)

## 2021-01-29 LAB — T3, FREE: T3, Free: 8.3 pg/mL — ABNORMAL HIGH (ref 2.0–4.4)

## 2021-02-05 ENCOUNTER — Encounter: Payer: Self-pay | Admitting: "Endocrinology

## 2021-02-05 ENCOUNTER — Ambulatory Visit (INDEPENDENT_AMBULATORY_CARE_PROVIDER_SITE_OTHER): Payer: Managed Care, Other (non HMO) | Admitting: "Endocrinology

## 2021-02-05 ENCOUNTER — Other Ambulatory Visit: Payer: Self-pay

## 2021-02-05 VITALS — BP 124/72 | HR 88 | Ht 63.0 in | Wt 192.4 lb

## 2021-02-05 DIAGNOSIS — E0590 Thyrotoxicosis, unspecified without thyrotoxic crisis or storm: Secondary | ICD-10-CM

## 2021-02-05 MED ORDER — PREDNISONE 10 MG PO TABS: 10.0000 mg | ORAL_TABLET | Freq: Every day | ORAL | 0 refills | Status: AC

## 2021-02-05 NOTE — Progress Notes (Signed)
02/05/2021     Endocrinology follow-up note  Subjective:    Patient ID: Lydia Norton, female    DOB: 1985-12-21, PCP Patient, No Pcp Per (Inactive).   Past Medical History:  Diagnosis Date   Cervicalgia    Chronic pain syndrome    GERD (gastroesophageal reflux disease)    Hypertension     Past Surgical History:  Procedure Laterality Date   HERNIA REPAIR     TONSILLECTOMY      Social History   Socioeconomic History   Marital status: Single    Spouse name: Not on file   Number of children: Not on file   Years of education: Not on file   Highest education level: Not on file  Occupational History   Not on file  Tobacco Use   Smoking status: Every Day    Packs/day: 1.00    Pack years: 0.00    Types: Cigarettes   Smokeless tobacco: Never  Substance and Sexual Activity   Alcohol use: No   Drug use: No   Sexual activity: Not on file  Other Topics Concern   Not on file  Social History Narrative   Not on file   Social Determinants of Health   Financial Resource Strain: Not on file  Food Insecurity: Not on file  Transportation Needs: Not on file  Physical Activity: Not on file  Stress: Not on file  Social Connections: Not on file    Family History  Problem Relation Age of Onset   Hypertension Mother    Diabetes Father    Hypertension Father    Hyperlipidemia Father    Heart attack Father    Stroke Father    Heart failure Father     Outpatient Encounter Medications as of 02/05/2021  Medication Sig   predniSONE (DELTASONE) 10 MG tablet Take 1 tablet (10 mg total) by mouth daily with breakfast.   amLODipine (NORVASC) 10 MG tablet Take 1 tablet (10 mg total) by mouth daily.   hydrochlorothiazide (HYDRODIURIL) 25 MG tablet Take 1 tablet (25 mg total) by mouth daily.   HYDROcodone-acetaminophen (NORCO/VICODIN) 5-325 MG tablet Take 1 tablet by mouth every 6 (six) hours as needed.   omeprazole (PRILOSEC) 40 MG capsule Take 40 mg by mouth daily.    No facility-administered encounter medications on file as of 02/05/2021.    ALLERGIES: Allergies  Allergen Reactions   Lisinopril Swelling    VACCINATION STATUS:  There is no immunization history on file for this patient.   HPI  Lydia Norton is 35 y.o. female who is being engaged in telehealth in follow-up for hyperthyroidism.    Due to her presentation with severe thyrotoxicosis, she was treated with high-dose methimazole until she achieved reasonable control before uptake and scan was performed which showed significant uptake consistent with Graves' disease, uniform uptake of 66% in 24 hours.   She was then offered radioactive iodine thyroid ablation which was administered on September 12, 2020.  Her thyroid function test on Dec 26, 2020 were such that she was still thyrotoxic.  Her most recent thyroid function tests from January 28, 2021 are consistent with treatment failure.  She has lost 5 more pounds since last visit.  She denies palpitations.  She is off of her propanolol and prednisone.   -Before her last visit she presented with a history consistent with 100+ pounds of weight loss.  She underwent thyroid ultrasound which showed diffuse enlargement of her thyroid lobes with no discrete  nodules.  she denies dysphagia, choking, shortness of breath, no recent voice change.    she has family history of thyroid dysfunction but denies family hx of thyroid cancer. she reports that she discovered her own goiter approximately 2 years ago.   she reports compliance with her methimazole 5 mg p.o. 3 times daily, not on beta-blockers, not on steroids.                            Review of systems Limited as above.  Objective:    BP 124/72   Pulse 88   Ht 5\' 3"  (1.6 m)   Wt 192 lb 6.4 oz (87.3 kg)   BMI 34.08 kg/m   Wt Readings from Last 3 Encounters:  02/05/21 192 lb 6.4 oz (87.3 kg)  01/01/21 197 lb (89.4 kg)  10/30/20 190 lb 3.2 oz (86.3 kg)                                                 Physical exam    CMP     Component Value Date/Time   NA 137 10/05/2016 1101   K 3.4 (L) 10/05/2016 1101   CL 104 10/05/2016 1101   CO2 26 10/05/2016 1101   GLUCOSE 93 10/05/2016 1101   BUN 10 02/22/2020 0000   CREATININE 0.3 (A) 02/22/2020 0000   CREATININE 0.61 10/05/2016 1101   CALCIUM 9.1 10/05/2016 1101   PROT 7.4 10/05/2016 1101   ALBUMIN 4.0 10/05/2016 1101   AST 22 10/05/2016 1101   ALT 32 10/05/2016 1101   ALKPHOS 49 10/05/2016 1101   BILITOT 0.4 10/05/2016 1101   GFRNONAA 146 02/22/2020 0000   GFRAA 169 02/22/2020 0000     CBC    Component Value Date/Time   WBC 8.4 10/05/2016 1101   RBC 4.84 10/05/2016 1101   HGB 14.1 10/05/2016 1101   HCT 40.6 10/05/2016 1101   PLT 278 10/05/2016 1101   MCV 83.9 10/05/2016 1101   MCH 29.1 10/05/2016 1101   MCHC 34.7 10/05/2016 1101   RDW 13.0 10/05/2016 1101   LYMPHSABS 3.0 07/06/2016 0039   MONOABS 0.6 07/06/2016 0039   EOSABS 0.2 07/06/2016 0039   BASOSABS 0.0 07/06/2016 0039     Diabetic Labs (most recent): Lab Results  Component Value Date   HGBA1C 4.9 02/22/2020    Lipid Panel     Component Value Date/Time   CHOL 194 02/22/2020 0000   TRIG 201 (A) 02/22/2020 0000   HDL 53 02/22/2020 0000   LDLCALC 109 02/22/2020 0000     Lab Results  Component Value Date   TSH <0.010 (L) 01/28/2021   TSH <0.010 (L) 12/26/2020   TSH <0.005 (L) 10/29/2020   TSH <0.010 (L) 08/07/2020   TSH 0.01 (A) 02/22/2020   FREET4 2.05 (H) 01/28/2021   FREET4 1.94 (H) 12/26/2020   FREET4 3.38 (H) 10/29/2020   FREET4 1.82 (H) 08/07/2020     Thyroid ultrasound on May 13, 2020: Right lobe 6.5 x 3.1 x 3.2 cm, heterogeneous, vascular.  No nodules. Left lobe: 6.5 x 3.4 x 2.9 cm heterogeneous, vascular.  No nodules.  Thyroid uptake and scan on August 28, 2020 FINDINGS: There is diffusely increased radiotracer uptake within both lobes of the thyroid gland. No dominant hot or cold nodule.   4 hour  I-123  uptake = 61.4% (normal 5-20%) 24 hour I-123 uptake = 66% (normal 10-30%)   IMPRESSION: 1. Markedly elevated 4 hour and 24 hour radioactive iodine uptake. Patient's hyperthyroidism is likely due to Graves disease which may be amendable to therapy with radiolabeled iodine.   September 12, 2020 RADIOPHARMACEUTICALS:  15.2 mCi I-131 sodium iodide orally   IMPRESSION: Per oral administration of I-131 sodium iodide for the treatment of hyperthyroidism.  Assessment & Plan:   1. Hyperthyroidism 2. Graves disease 3.  Treatment failure She is status post RAI thyroid ablation on September 12, 2020.  Her previsit labs are consistent with treatment failure.  This is despite 15.2 mCi of I-131 oral administration. I discussed her presentation with Dr. Ulyses Southward her nuclear medicine [facility].  She will need retreatment preceded by thyroid uptake and scan. -She would benefit from retreatment with prednisone 10 mg p.o. daily for the next 15 days. She does not have palpitations, advised to use propanolol 20 mg p.o. twice daily if she develops palpitation. She will return in 5-6 days with her uptake and scan results.  Her blood pressure is better controlled today.  She is advised to continue amlodipine 10 mg p.o. daily, HCTZ 25 mg p.o. daily.  She is allergic to lisinopril. She is advised on salt restrictions.  -Patient is advised to maintain close follow up with her PCP  for primary care needs.   I spent 25 minutes in the care of the patient today including review of labs from Thyroid Function, CMP, and other relevant labs ; imaging/biopsy records (current and previous including abstractions from other facilities); face-to-face time discussing  her lab results and symptoms, medications doses, her options of short and long term treatment based on the latest standards of care / guidelines;   and documenting the encounter.  Lydia Norton  participated in the discussions, expressed  understanding, and voiced agreement with the above plans.  All questions were answered to her satisfaction. she is encouraged to contact clinic should she have any questions or concerns prior to her return visit.    Follow up plan: Return in about 5 days (around 02/10/2021) for F/U with Thyroid Uptake and Scan.   Thank you for involving me in the care of this pleasant patient, and I will continue to update you with her progress.  Marquis Lunch, MD [facility] Phone: [phone]  Fax: [phone]   02/05/2021, 6:23 PM  This note was partially dictated with voice recognition software. Similar sounding words can be transcribed inadequately or may not  be corrected upon review.

## 2021-02-06 ENCOUNTER — Other Ambulatory Visit: Payer: Self-pay | Admitting: "Endocrinology

## 2021-02-20 ENCOUNTER — Other Ambulatory Visit: Payer: Self-pay | Admitting: "Endocrinology

## 2021-02-24 ENCOUNTER — Ambulatory Visit: Payer: Medicaid Other | Admitting: Nurse Practitioner

## 2021-03-12 ENCOUNTER — Encounter (HOSPITAL_COMMUNITY)
Admission: RE | Admit: 2021-03-12 | Discharge: 2021-03-12 | Disposition: A | Discharge status: Home / Self Care | Payer: Managed Care, Other (non HMO) | Source: Ambulatory Visit | Attending: "Endocrinology | Admitting: "Endocrinology

## 2021-03-12 ENCOUNTER — Other Ambulatory Visit: Payer: Self-pay

## 2021-03-12 ENCOUNTER — Encounter (HOSPITAL_COMMUNITY): Payer: Self-pay

## 2021-03-12 DIAGNOSIS — E0590 Thyrotoxicosis, unspecified without thyrotoxic crisis or storm: Secondary | ICD-10-CM | POA: Diagnosis present

## 2021-03-12 IMAGING — NM NM THYROID IMAGING W/ UPTAKE MULTI (4&24 HR)
4 series · 4 of 4 positions shown · non-contrast
Comparison: [DATE]

CLINICAL DATA: Hyperthyroidism.  Treatment failure.

EXAM:
THYROID SCAN AND UPTAKE - 4 AND 24 HOURS
TECHNIQUE: Following oral administration of [SD] capsule, anterior planar
imaging was acquired at 24 hours. Thyroid uptake was calculated with
a thyroid probe at 4-6 hours and 24 hours.
RADIOPHARMACEUTICALS:  Three hundred twenty-seven uCi [SD] sodium
iodide p.o.

[Series 1: anterior · 1.18mm/px · 1 of 1 slices shown]
[im 1/1]
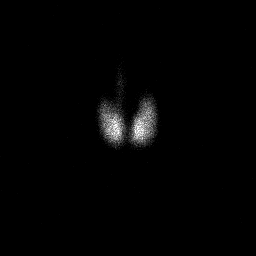

[Series 2: ant w marker · 1.18mm/px · 1 of 1 slices shown]
[im 1/1]
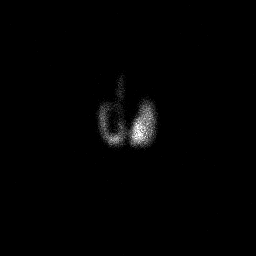

[Series 3: lao · 1.18mm/px · 1 of 1 slices shown]
[im 1/1]
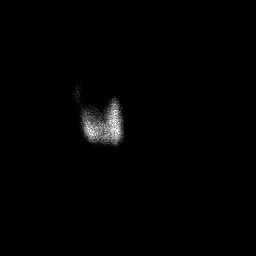

[Series 4: rao · 1.18mm/px · 1 of 1 slices shown]
[im 1/1]
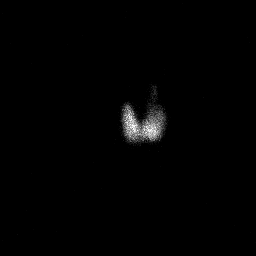

[4 of 4 positions shown; findings below may reference images not displayed]

FINDINGS: There is homogeneous uptake in both thyroid lobes and a pyramidal
lobe. No nodules or masses.

4 hour [SD] uptake = 38.6% (normal 5-20%)

24 hour [SD] uptake = 52.7% (normal 10-30%)
IMPRESSION: 1. Homogeneous uptake in the thyroid lobes and a pyramidal lobe.
This has the appearance of Graves disease.
2. The 4 uptake is 38.6% today versus 61.4% previously. The 24
uptake is 52.7% today versus 66% previously.

## 2021-03-12 MED ORDER — SODIUM IODIDE I-123 7.4 MBQ CAPS
500.0000 | ORAL_CAPSULE | Freq: Once | ORAL | Status: AC
  Administered 2021-03-12: 327 via ORAL

## 2021-03-13 ENCOUNTER — Encounter (HOSPITAL_COMMUNITY)
Admission: RE | Admit: 2021-03-13 | Discharge: 2021-03-13 | Disposition: A | Discharge status: Home / Self Care | Payer: Managed Care, Other (non HMO) | Source: Ambulatory Visit | Attending: "Endocrinology | Admitting: "Endocrinology

## 2021-06-14 ENCOUNTER — Other Ambulatory Visit: Payer: Self-pay | Admitting: "Endocrinology

## 2021-06-15 NOTE — Telephone Encounter (Signed)
Last OV 02/05/2021  No future appt has been scheduled.  Last note stated to use requested medication if needed. Do you want to refill or does patient need an appointment first?

## 2021-07-14 ENCOUNTER — Other Ambulatory Visit: Payer: Self-pay | Admitting: "Endocrinology

## 2021-08-21 ENCOUNTER — Other Ambulatory Visit: Payer: Self-pay | Admitting: "Endocrinology

## 2021-08-24 ENCOUNTER — Other Ambulatory Visit: Payer: Self-pay | Admitting: "Endocrinology

## 2021-08-25 ENCOUNTER — Other Ambulatory Visit: Payer: Self-pay | Admitting: "Endocrinology

## 2021-09-21 ENCOUNTER — Telehealth: Payer: Self-pay | Admitting: Nurse Practitioner

## 2021-09-21 ENCOUNTER — Other Ambulatory Visit: Payer: Self-pay | Admitting: "Endocrinology

## 2021-09-21 DIAGNOSIS — E0590 Thyrotoxicosis, unspecified without thyrotoxic crisis or storm: Secondary | ICD-10-CM

## 2021-09-21 NOTE — Telephone Encounter (Signed)
Pt has not been seen since last June, she did scan it looks like last July does she need labs before returning

## 2021-09-22 NOTE — Telephone Encounter (Signed)
Tried to contact pt this morning no answer

## 2023-01-19 ENCOUNTER — Ambulatory Visit: Payer: 59 | Admitting: Internal Medicine

## 2023-03-21 ENCOUNTER — Telehealth: Payer: Self-pay | Admitting: "Endocrinology

## 2023-03-21 NOTE — Telephone Encounter (Signed)
Received labs on pt from PCP. They want her to be seen. Her labs are under media. If pt calls back, schedule appt
